# Patient Record
Sex: Male | Born: 1980 | Race: Black or African American | Hispanic: No | Marital: Single | Smoking: Never smoker
Health system: Southern US, Community
[De-identification: ages and names within clinical notes are randomized; demographics above are authoritative.]

## PROBLEM LIST (undated history)

## (undated) DIAGNOSIS — F79 Unspecified intellectual disabilities: Secondary | ICD-10-CM

## (undated) DIAGNOSIS — E119 Type 2 diabetes mellitus without complications: Secondary | ICD-10-CM

## (undated) DIAGNOSIS — M419 Scoliosis, unspecified: Secondary | ICD-10-CM

## (undated) HISTORY — PX: BACK SURGERY: SHX140

---

## 2012-01-19 DIAGNOSIS — R42 Dizziness and giddiness: Secondary | ICD-10-CM | POA: Diagnosis not present

## 2012-01-19 DIAGNOSIS — M549 Dorsalgia, unspecified: Secondary | ICD-10-CM | POA: Diagnosis not present

## 2012-01-19 DIAGNOSIS — F3342 Major depressive disorder, recurrent, in full remission: Secondary | ICD-10-CM | POA: Diagnosis not present

## 2012-01-19 DIAGNOSIS — F3132 Bipolar disorder, current episode depressed, moderate: Secondary | ICD-10-CM | POA: Diagnosis not present

## 2012-04-20 DIAGNOSIS — M4120 Other idiopathic scoliosis, site unspecified: Secondary | ICD-10-CM | POA: Diagnosis not present

## 2012-05-09 DIAGNOSIS — F3132 Bipolar disorder, current episode depressed, moderate: Secondary | ICD-10-CM | POA: Diagnosis not present

## 2012-08-09 DIAGNOSIS — M549 Dorsalgia, unspecified: Secondary | ICD-10-CM | POA: Diagnosis not present

## 2012-08-09 DIAGNOSIS — F3132 Bipolar disorder, current episode depressed, moderate: Secondary | ICD-10-CM | POA: Diagnosis not present

## 2012-08-10 DIAGNOSIS — Z981 Arthrodesis status: Secondary | ICD-10-CM | POA: Diagnosis not present

## 2012-08-10 DIAGNOSIS — IMO0002 Reserved for concepts with insufficient information to code with codable children: Secondary | ICD-10-CM | POA: Diagnosis not present

## 2012-08-10 DIAGNOSIS — M4120 Other idiopathic scoliosis, site unspecified: Secondary | ICD-10-CM | POA: Diagnosis not present

## 2012-08-10 DIAGNOSIS — S32009A Unspecified fracture of unspecified lumbar vertebra, initial encounter for closed fracture: Secondary | ICD-10-CM | POA: Diagnosis not present

## 2012-08-10 DIAGNOSIS — M549 Dorsalgia, unspecified: Secondary | ICD-10-CM | POA: Diagnosis not present

## 2012-12-13 DIAGNOSIS — F3132 Bipolar disorder, current episode depressed, moderate: Secondary | ICD-10-CM | POA: Diagnosis not present

## 2012-12-13 DIAGNOSIS — Z131 Encounter for screening for diabetes mellitus: Secondary | ICD-10-CM | POA: Diagnosis not present

## 2012-12-13 DIAGNOSIS — E119 Type 2 diabetes mellitus without complications: Secondary | ICD-10-CM | POA: Diagnosis not present

## 2012-12-13 DIAGNOSIS — Z13220 Encounter for screening for lipoid disorders: Secondary | ICD-10-CM | POA: Diagnosis not present

## 2012-12-13 DIAGNOSIS — E782 Mixed hyperlipidemia: Secondary | ICD-10-CM | POA: Diagnosis not present

## 2013-03-14 DIAGNOSIS — F3110 Bipolar disorder, current episode manic without psychotic features, unspecified: Secondary | ICD-10-CM | POA: Diagnosis not present

## 2013-03-29 DIAGNOSIS — F79 Unspecified intellectual disabilities: Secondary | ICD-10-CM | POA: Diagnosis not present

## 2013-03-29 DIAGNOSIS — M4120 Other idiopathic scoliosis, site unspecified: Secondary | ICD-10-CM | POA: Diagnosis not present

## 2013-05-28 DIAGNOSIS — F71 Moderate intellectual disabilities: Secondary | ICD-10-CM | POA: Diagnosis not present

## 2013-05-28 DIAGNOSIS — M4120 Other idiopathic scoliosis, site unspecified: Secondary | ICD-10-CM | POA: Diagnosis not present

## 2013-07-21 DIAGNOSIS — M549 Dorsalgia, unspecified: Secondary | ICD-10-CM | POA: Diagnosis not present

## 2013-07-27 DIAGNOSIS — M4120 Other idiopathic scoliosis, site unspecified: Secondary | ICD-10-CM | POA: Diagnosis not present

## 2013-07-27 DIAGNOSIS — F79 Unspecified intellectual disabilities: Secondary | ICD-10-CM | POA: Diagnosis not present

## 2013-10-27 DIAGNOSIS — R42 Dizziness and giddiness: Secondary | ICD-10-CM | POA: Diagnosis not present

## 2013-10-27 DIAGNOSIS — M549 Dorsalgia, unspecified: Secondary | ICD-10-CM | POA: Diagnosis not present

## 2013-10-27 DIAGNOSIS — Z79899 Other long term (current) drug therapy: Secondary | ICD-10-CM | POA: Diagnosis not present

## 2014-02-02 DIAGNOSIS — Z136 Encounter for screening for cardiovascular disorders: Secondary | ICD-10-CM | POA: Diagnosis not present

## 2014-02-02 DIAGNOSIS — IMO0001 Reserved for inherently not codable concepts without codable children: Secondary | ICD-10-CM | POA: Diagnosis not present

## 2014-04-27 DIAGNOSIS — IMO0001 Reserved for inherently not codable concepts without codable children: Secondary | ICD-10-CM | POA: Diagnosis not present

## 2014-06-12 DIAGNOSIS — F72 Severe intellectual disabilities: Secondary | ICD-10-CM | POA: Diagnosis not present

## 2014-06-16 DIAGNOSIS — F72 Severe intellectual disabilities: Secondary | ICD-10-CM | POA: Diagnosis not present

## 2014-08-03 DIAGNOSIS — IMO0001 Reserved for inherently not codable concepts without codable children: Secondary | ICD-10-CM | POA: Diagnosis not present

## 2014-11-30 DIAGNOSIS — E1165 Type 2 diabetes mellitus with hyperglycemia: Secondary | ICD-10-CM | POA: Diagnosis not present

## 2015-03-08 DIAGNOSIS — I10 Essential (primary) hypertension: Secondary | ICD-10-CM | POA: Diagnosis not present

## 2015-03-08 DIAGNOSIS — Z0000 Encounter for general adult medical examination without abnormal findings: Secondary | ICD-10-CM | POA: Diagnosis not present

## 2015-03-08 DIAGNOSIS — E1165 Type 2 diabetes mellitus with hyperglycemia: Secondary | ICD-10-CM | POA: Diagnosis not present

## 2015-03-15 DIAGNOSIS — I10 Essential (primary) hypertension: Secondary | ICD-10-CM | POA: Diagnosis not present

## 2015-03-15 DIAGNOSIS — E1165 Type 2 diabetes mellitus with hyperglycemia: Secondary | ICD-10-CM | POA: Diagnosis not present

## 2015-03-28 DIAGNOSIS — M419 Scoliosis, unspecified: Secondary | ICD-10-CM | POA: Diagnosis not present

## 2015-03-28 DIAGNOSIS — M549 Dorsalgia, unspecified: Secondary | ICD-10-CM | POA: Diagnosis not present

## 2015-03-28 DIAGNOSIS — Z981 Arthrodesis status: Secondary | ICD-10-CM | POA: Diagnosis not present

## 2015-06-07 DIAGNOSIS — I10 Essential (primary) hypertension: Secondary | ICD-10-CM | POA: Diagnosis not present

## 2015-06-07 DIAGNOSIS — E1165 Type 2 diabetes mellitus with hyperglycemia: Secondary | ICD-10-CM | POA: Diagnosis not present

## 2015-09-09 DIAGNOSIS — E1165 Type 2 diabetes mellitus with hyperglycemia: Secondary | ICD-10-CM | POA: Diagnosis not present

## 2015-09-09 DIAGNOSIS — I10 Essential (primary) hypertension: Secondary | ICD-10-CM | POA: Diagnosis not present

## 2015-12-09 DIAGNOSIS — I10 Essential (primary) hypertension: Secondary | ICD-10-CM | POA: Diagnosis not present

## 2015-12-09 DIAGNOSIS — E1165 Type 2 diabetes mellitus with hyperglycemia: Secondary | ICD-10-CM | POA: Diagnosis not present

## 2016-03-09 DIAGNOSIS — I10 Essential (primary) hypertension: Secondary | ICD-10-CM | POA: Diagnosis not present

## 2016-03-09 DIAGNOSIS — E1165 Type 2 diabetes mellitus with hyperglycemia: Secondary | ICD-10-CM | POA: Diagnosis not present

## 2016-04-29 DIAGNOSIS — E1165 Type 2 diabetes mellitus with hyperglycemia: Secondary | ICD-10-CM | POA: Diagnosis not present

## 2016-04-29 DIAGNOSIS — I10 Essential (primary) hypertension: Secondary | ICD-10-CM | POA: Diagnosis not present

## 2016-05-18 DIAGNOSIS — I10 Essential (primary) hypertension: Secondary | ICD-10-CM | POA: Diagnosis not present

## 2016-05-18 DIAGNOSIS — E1165 Type 2 diabetes mellitus with hyperglycemia: Secondary | ICD-10-CM | POA: Diagnosis not present

## 2016-06-23 DIAGNOSIS — F338 Other recurrent depressive disorders: Secondary | ICD-10-CM | POA: Diagnosis not present

## 2016-06-23 DIAGNOSIS — I10 Essential (primary) hypertension: Secondary | ICD-10-CM | POA: Diagnosis not present

## 2016-06-23 DIAGNOSIS — E1165 Type 2 diabetes mellitus with hyperglycemia: Secondary | ICD-10-CM | POA: Diagnosis not present

## 2016-08-03 ENCOUNTER — Encounter (HOSPITAL_COMMUNITY): Payer: Self-pay | Admitting: Emergency Medicine

## 2016-08-03 ENCOUNTER — Emergency Department (HOSPITAL_COMMUNITY)
Admission: EM | Admit: 2016-08-03 | Discharge: 2016-08-03 | Disposition: A | Discharge status: Home / Self Care | Payer: Medicare Other | Attending: Emergency Medicine | Admitting: Emergency Medicine

## 2016-08-03 ENCOUNTER — Emergency Department (HOSPITAL_COMMUNITY): Payer: Medicare Other

## 2016-08-03 DIAGNOSIS — Y999 Unspecified external cause status: Secondary | ICD-10-CM | POA: Diagnosis not present

## 2016-08-03 DIAGNOSIS — W19XXXA Unspecified fall, initial encounter: Secondary | ICD-10-CM | POA: Insufficient documentation

## 2016-08-03 DIAGNOSIS — E119 Type 2 diabetes mellitus without complications: Secondary | ICD-10-CM | POA: Diagnosis not present

## 2016-08-03 DIAGNOSIS — S0990XA Unspecified injury of head, initial encounter: Secondary | ICD-10-CM | POA: Diagnosis not present

## 2016-08-03 DIAGNOSIS — Y939 Activity, unspecified: Secondary | ICD-10-CM | POA: Diagnosis not present

## 2016-08-03 DIAGNOSIS — Y929 Unspecified place or not applicable: Secondary | ICD-10-CM | POA: Insufficient documentation

## 2016-08-03 DIAGNOSIS — R51 Headache: Secondary | ICD-10-CM | POA: Insufficient documentation

## 2016-08-03 DIAGNOSIS — R1110 Vomiting, unspecified: Secondary | ICD-10-CM | POA: Insufficient documentation

## 2016-08-03 DIAGNOSIS — Z043 Encounter for examination and observation following other accident: Secondary | ICD-10-CM | POA: Diagnosis not present

## 2016-08-03 DIAGNOSIS — S199XXA Unspecified injury of neck, initial encounter: Secondary | ICD-10-CM | POA: Diagnosis not present

## 2016-08-03 HISTORY — DX: Scoliosis, unspecified: M41.9

## 2016-08-03 HISTORY — DX: Unspecified intellectual disabilities: F79

## 2016-08-03 HISTORY — DX: Type 2 diabetes mellitus without complications: E11.9

## 2016-08-03 LAB — BASIC METABOLIC PANEL
ANION GAP: 8 (ref 5–15)
BUN: 13 mg/dL (ref 6–20)
CHLORIDE: 101 mmol/L (ref 101–111)
CO2: 29 mmol/L (ref 22–32)
Calcium: 9.3 mg/dL (ref 8.9–10.3)
Creatinine, Ser: 0.85 mg/dL (ref 0.61–1.24)
GFR calc Af Amer: >60 mL/min (ref >60)
GFR calc non Af Amer: >60 mL/min (ref >60)
Glucose, Bld: 83 mg/dL (ref 65–99)
POTASSIUM: 3.7 mmol/L (ref 3.5–5.1)
SODIUM: 138 mmol/L (ref 135–145)

## 2016-08-03 LAB — CBC WITH DIFFERENTIAL/PLATELET
BASOS ABS: 0 10*3/uL (ref 0.0–0.1)
Basophils Relative: 0 %
EOS ABS: 0 10*3/uL (ref 0.0–0.7)
Eosinophils Relative: 0 %
HCT: 40.2 % (ref 39.0–52.0)
HEMOGLOBIN: 13.5 g/dL (ref 13.0–17.0)
LYMPHS ABS: 1 10*3/uL (ref 0.7–4.0)
LYMPHS PCT: 14 %
MCH: 27.7 pg (ref 26.0–34.0)
MCHC: 33.6 g/dL (ref 30.0–36.0)
MCV: 82.4 fL (ref 78.0–100.0)
Monocytes Absolute: 0.3 10*3/uL (ref 0.1–1.0)
Monocytes Relative: 5 %
NEUTROS PCT: 81 %
Neutro Abs: 5.5 10*3/uL (ref 1.7–7.7)
Platelets: 181 10*3/uL (ref 150–400)
RBC: 4.88 MIL/uL (ref 4.22–5.81)
RDW: 13 % (ref 11.5–15.5)
WBC: 6.8 10*3/uL (ref 4.0–10.5)

## 2016-08-03 NOTE — Discharge Instructions (Signed)
Follow-up with your family doctor if any problems. Take Tylenol for pain

## 2016-08-03 NOTE — ED Notes (Signed)
Patient transported to CT 

## 2016-08-03 NOTE — ED Provider Notes (Signed)
[facility] Provider Note   CSN: [id number] Arrival date & time: 08/03/16  1054  By signing my name below, I, Placido Sou, attest that this documentation has been prepared under the direction and in the presence of Bethann Berkshire, MD. Electronically Signed: Placido Sou, ED Scribe. 08/03/16. 12:30 PM.   History   Chief Complaint Chief Complaint  Patient presents with  . Fall    HPI HPI Comments: Phillip White is a 35 y.o. male with a h/o MR who presents to the Emergency Department complaining of a fall that occurred this morning. His mother states he resides at a facility for adults with MR and that she was told he fell this morning and the staff is unsure if he struck his head. He has experienced 2x vomiting since the fall and the staff told her he was disoriented. Pt endorses posterior scalp tenderness but his mother denies he is experiencing any. His mother states he is currently behaving at his baseline. He denies n/v or other associated symptoms noted at this time.   The history is provided by the patient, a parent, a caregiver and medical records. No language interpreter was used.  Fall  This is a new problem. The current episode started 3 to 5 hours ago. The problem occurs rarely. The problem has not changed since onset.Associated symptoms include headaches. Pertinent negatives include no chest pain and no abdominal pain. Nothing aggravates the symptoms. Nothing relieves the symptoms. He has tried nothing for the symptoms. The treatment provided no relief.    Past Medical History:  Diagnosis Date  . Diabetes mellitus without complication (HCC)    on metformin  . Mental retardation   . Scoliosis     There are no active problems to display for this patient.   Past Surgical History:  Procedure Laterality Date  . BACK SURGERY         Home Medications    Prior to Admission medications   Not on File    Family History Family History  Problem Relation  Age of Onset  . Diabetes Mother   . Alzheimer's disease Other   . Heart failure Other     Social History Social History  Substance Use Topics  . Smoking status: Never Smoker  . Smokeless tobacco: Never Used  . Alcohol use No     Allergies   Review of patient's allergies indicates no known allergies.   Review of Systems Review of Systems  Constitutional: Negative for appetite change and fatigue.  HENT: Negative for congestion, ear discharge and sinus pressure.   Eyes: Negative for discharge.  Respiratory: Negative for cough.   Cardiovascular: Negative for chest pain.  Gastrointestinal: Positive for vomiting. Negative for abdominal pain, diarrhea and nausea.  Genitourinary: Negative for frequency and hematuria.  Musculoskeletal: Negative for back pain.  Skin: Negative for rash.  Neurological: Positive for headaches. Negative for seizures.  Psychiatric/Behavioral: Negative for hallucinations.   Physical Exam Updated Vital Signs BP 99/65 (BP Location: Right Arm)   Pulse 69   Temp 97.5 F (36.4 C) (Oral)   Resp 16   Ht 5\' 6"  (1.676 m)   Wt 153 lb (69.4 kg)   SpO2 100%   BMI 24.69 kg/m   Physical Exam  HENT:  Head: Normocephalic.  Tenderness to occipital head   Eyes: Conjunctivae and EOM are normal. No scleral icterus.  Neck: Neck supple. No thyromegaly present.  Cardiovascular: Normal rate and regular rhythm.  Exam reveals no gallop and no friction rub.  No murmur heard. Pulmonary/Chest: No stridor. He has no wheezes. He has no rales. He exhibits no tenderness.  Abdominal: He exhibits no distension. There is no tenderness. There is no rebound.  Musculoskeletal: Normal range of motion. He exhibits no edema.  Lymphadenopathy:    He has no cervical adenopathy.  Neurological: He exhibits normal muscle tone. Coordination normal.  Pt is oriented and exhibits his nml mental deficiencies   Skin: No rash noted. No erythema.  Psychiatric: He has a normal mood and  affect. His behavior is normal.   ED Treatments / Results  Labs (all labs ordered are listed, but only abnormal results are displayed) Labs Reviewed - No data to display  EKG  EKG Interpretation None       Radiology No results found.  Procedures Procedures  DIAGNOSTIC STUDIES: Oxygen Saturation is 100% on RA, normal by my interpretation.    COORDINATION OF CARE: 12:30 PM Discussed next steps with pt and his mother. His mother verbalized understanding and is agreeable with the plan.    Medications Ordered in ED Medications - No data to display   Initial Impression / Assessment and Plan / ED Course  I have reviewed the triage vital signs and the nursing notes.  Pertinent labs & imaging results that were available during my care of the patient were reviewed by me and considered in my medical decision making (see chart for details).  Clinical Course  Patient with fall with no injuries he is to follow-up with his PCP   Final Clinical Impressions(s) / ED Diagnoses   Final diagnoses:  None    New Prescriptions New Prescriptions   No medications on file     Bethann BerkshireJoseph Zaia Carre, MD 08/03/16 1537

## 2016-08-03 NOTE — ED Triage Notes (Signed)
Pt fell this am, facility unsure if pt hit his head. Pt has been disoriented and has vomited x 2. Pt ambulatory to room.

## 2016-08-14 DIAGNOSIS — E1165 Type 2 diabetes mellitus with hyperglycemia: Secondary | ICD-10-CM | POA: Diagnosis not present

## 2016-08-14 DIAGNOSIS — I10 Essential (primary) hypertension: Secondary | ICD-10-CM | POA: Diagnosis not present

## 2016-08-21 DIAGNOSIS — E1165 Type 2 diabetes mellitus with hyperglycemia: Secondary | ICD-10-CM | POA: Diagnosis not present

## 2016-08-21 DIAGNOSIS — I10 Essential (primary) hypertension: Secondary | ICD-10-CM | POA: Diagnosis not present

## 2016-08-27 DIAGNOSIS — I10 Essential (primary) hypertension: Secondary | ICD-10-CM | POA: Diagnosis not present

## 2016-08-27 DIAGNOSIS — E1165 Type 2 diabetes mellitus with hyperglycemia: Secondary | ICD-10-CM | POA: Diagnosis not present

## 2016-08-27 DIAGNOSIS — E119 Type 2 diabetes mellitus without complications: Secondary | ICD-10-CM | POA: Diagnosis not present

## 2016-09-17 DIAGNOSIS — I10 Essential (primary) hypertension: Secondary | ICD-10-CM | POA: Diagnosis not present

## 2016-09-17 DIAGNOSIS — E1165 Type 2 diabetes mellitus with hyperglycemia: Secondary | ICD-10-CM | POA: Diagnosis not present

## 2016-09-23 DIAGNOSIS — I10 Essential (primary) hypertension: Secondary | ICD-10-CM | POA: Diagnosis not present

## 2016-09-23 DIAGNOSIS — E1165 Type 2 diabetes mellitus with hyperglycemia: Secondary | ICD-10-CM | POA: Diagnosis not present

## 2016-09-23 DIAGNOSIS — F338 Other recurrent depressive disorders: Secondary | ICD-10-CM | POA: Diagnosis not present

## 2016-10-22 DIAGNOSIS — I10 Essential (primary) hypertension: Secondary | ICD-10-CM | POA: Diagnosis not present

## 2016-10-22 DIAGNOSIS — E1165 Type 2 diabetes mellitus with hyperglycemia: Secondary | ICD-10-CM | POA: Diagnosis not present

## 2016-12-25 DIAGNOSIS — E1165 Type 2 diabetes mellitus with hyperglycemia: Secondary | ICD-10-CM | POA: Diagnosis not present

## 2016-12-25 DIAGNOSIS — F338 Other recurrent depressive disorders: Secondary | ICD-10-CM | POA: Diagnosis not present

## 2016-12-25 DIAGNOSIS — I10 Essential (primary) hypertension: Secondary | ICD-10-CM | POA: Diagnosis not present

## 2017-02-11 DIAGNOSIS — F338 Other recurrent depressive disorders: Secondary | ICD-10-CM | POA: Diagnosis not present

## 2017-02-11 DIAGNOSIS — E1165 Type 2 diabetes mellitus with hyperglycemia: Secondary | ICD-10-CM | POA: Diagnosis not present

## 2017-02-11 DIAGNOSIS — I10 Essential (primary) hypertension: Secondary | ICD-10-CM | POA: Diagnosis not present

## 2017-03-26 DIAGNOSIS — E1165 Type 2 diabetes mellitus with hyperglycemia: Secondary | ICD-10-CM | POA: Diagnosis not present

## 2017-03-26 DIAGNOSIS — F338 Other recurrent depressive disorders: Secondary | ICD-10-CM | POA: Diagnosis not present

## 2017-03-26 DIAGNOSIS — I10 Essential (primary) hypertension: Secondary | ICD-10-CM | POA: Diagnosis not present

## 2017-06-15 DIAGNOSIS — I10 Essential (primary) hypertension: Secondary | ICD-10-CM | POA: Diagnosis not present

## 2017-06-15 DIAGNOSIS — E1165 Type 2 diabetes mellitus with hyperglycemia: Secondary | ICD-10-CM | POA: Diagnosis not present

## 2017-06-27 ENCOUNTER — Emergency Department (HOSPITAL_COMMUNITY)
Admission: EM | Admit: 2017-06-27 | Discharge: 2017-06-28 | Disposition: A | Discharge status: Home / Self Care | Payer: Medicare Other | Attending: Emergency Medicine | Admitting: Emergency Medicine

## 2017-06-27 ENCOUNTER — Emergency Department (HOSPITAL_COMMUNITY): Payer: Medicare Other

## 2017-06-27 DIAGNOSIS — E119 Type 2 diabetes mellitus without complications: Secondary | ICD-10-CM | POA: Diagnosis not present

## 2017-06-27 DIAGNOSIS — Y658 Other specified misadventures during surgical and medical care: Secondary | ICD-10-CM | POA: Diagnosis not present

## 2017-06-27 DIAGNOSIS — Z7984 Long term (current) use of oral hypoglycemic drugs: Secondary | ICD-10-CM | POA: Insufficient documentation

## 2017-06-27 DIAGNOSIS — T7849XA Other allergy, initial encounter: Secondary | ICD-10-CM | POA: Diagnosis not present

## 2017-06-27 DIAGNOSIS — R220 Localized swelling, mass and lump, head: Secondary | ICD-10-CM | POA: Diagnosis not present

## 2017-06-27 DIAGNOSIS — R509 Fever, unspecified: Secondary | ICD-10-CM

## 2017-06-27 DIAGNOSIS — T7840XA Allergy, unspecified, initial encounter: Secondary | ICD-10-CM

## 2017-06-27 DIAGNOSIS — T887XXA Unspecified adverse effect of drug or medicament, initial encounter: Secondary | ICD-10-CM | POA: Insufficient documentation

## 2017-06-27 DIAGNOSIS — Z79899 Other long term (current) drug therapy: Secondary | ICD-10-CM | POA: Insufficient documentation

## 2017-06-27 DIAGNOSIS — R21 Rash and other nonspecific skin eruption: Secondary | ICD-10-CM | POA: Diagnosis present

## 2017-06-27 LAB — COMPREHENSIVE METABOLIC PANEL
ALK PHOS: 51 U/L (ref 38–126)
ALT: 157 U/L — AB (ref 17–63)
AST: 134 U/L — ABNORMAL HIGH (ref 15–41)
Albumin: 3.4 g/dL — ABNORMAL LOW (ref 3.5–5.0)
Anion gap: 9 (ref 5–15)
BILIRUBIN TOTAL: 0.5 mg/dL (ref 0.3–1.2)
BUN: 10 mg/dL (ref 6–20)
CALCIUM: 8.5 mg/dL — AB (ref 8.9–10.3)
CHLORIDE: 97 mmol/L — AB (ref 101–111)
CO2: 27 mmol/L (ref 22–32)
CREATININE: 1.23 mg/dL (ref 0.61–1.24)
Glucose, Bld: 134 mg/dL — ABNORMAL HIGH (ref 65–99)
Potassium: 3.4 mmol/L — ABNORMAL LOW (ref 3.5–5.1)
Sodium: 133 mmol/L — ABNORMAL LOW (ref 135–145)
TOTAL PROTEIN: 6.5 g/dL (ref 6.5–8.1)

## 2017-06-27 LAB — CBC WITH DIFFERENTIAL/PLATELET
Basophils Absolute: 0 10*3/uL (ref 0.0–0.1)
Basophils Relative: 0 %
EOS PCT: 7 %
Eosinophils Absolute: 0.2 10*3/uL (ref 0.0–0.7)
HEMATOCRIT: 38 % — AB (ref 39.0–52.0)
HEMOGLOBIN: 12.9 g/dL — AB (ref 13.0–17.0)
LYMPHS ABS: 0.9 10*3/uL (ref 0.7–4.0)
LYMPHS PCT: 24 %
MCH: 27.8 pg (ref 26.0–34.0)
MCHC: 33.9 g/dL (ref 30.0–36.0)
MCV: 81.9 fL (ref 78.0–100.0)
Monocytes Absolute: 0.2 10*3/uL (ref 0.1–1.0)
Monocytes Relative: 5 %
NEUTROS ABS: 2.3 10*3/uL (ref 1.7–7.7)
Neutrophils Relative %: 64 %
Platelets: 146 10*3/uL — ABNORMAL LOW (ref 150–400)
RBC: 4.64 MIL/uL (ref 4.22–5.81)
RDW: 13.4 % (ref 11.5–15.5)
WBC: 3.6 10*3/uL — AB (ref 4.0–10.5)

## 2017-06-27 LAB — I-STAT CG4 LACTIC ACID, ED: LACTIC ACID, VENOUS: 1.06 mmol/L (ref 0.5–1.9)

## 2017-06-27 MED ORDER — SODIUM CHLORIDE 0.9 % IV BOLUS (SEPSIS)
1000.0000 mL | Freq: Once | INTRAVENOUS | Status: AC
  Administered 2017-06-28: 1000 mL via INTRAVENOUS

## 2017-06-27 MED ORDER — EPINEPHRINE 0.3 MG/0.3ML IJ SOAJ: 0.3000 mg | Freq: Once | INTRAMUSCULAR | 1 refills | Status: AC | PRN

## 2017-06-27 MED ORDER — EPINEPHRINE 0.3 MG/0.3ML IJ SOAJ
0.3000 mg | Freq: Once | INTRAMUSCULAR | Status: DC
  Filled 2017-06-27 (×2): qty 0.3

## 2017-06-27 MED ORDER — FAMOTIDINE IN NACL 20-0.9 MG/50ML-% IV SOLN
20.0000 mg | INTRAVENOUS | Status: AC
  Administered 2017-06-27: 20 mg via INTRAVENOUS
  Filled 2017-06-27: qty 50

## 2017-06-27 MED ORDER — EPINEPHRINE 0.3 MG/0.3ML IJ SOAJ
INTRAMUSCULAR | Status: AC
  Administered 2017-06-27: 0.3 mg via INTRAMUSCULAR
  Filled 2017-06-27: qty 0.3

## 2017-06-27 MED ORDER — SODIUM CHLORIDE 0.9 % IV BOLUS (SEPSIS)
1000.0000 mL | Freq: Once | INTRAVENOUS | Status: AC
  Administered 2017-06-27: 1000 mL via INTRAVENOUS

## 2017-06-27 MED ORDER — SODIUM CHLORIDE 0.9 % IV BOLUS (SEPSIS): 2000.0000 mL | Freq: Once | INTRAVENOUS | Status: DC

## 2017-06-27 MED ORDER — PREDNISONE 20 MG PO TABS: 40.0000 mg | ORAL_TABLET | Freq: Every day | ORAL | 0 refills | Status: AC

## 2017-06-27 MED ORDER — ACETAMINOPHEN 325 MG PO TABS
650.0000 mg | ORAL_TABLET | Freq: Once | ORAL | Status: AC
  Administered 2017-06-27: 650 mg via ORAL
  Filled 2017-06-27: qty 2

## 2017-06-27 MED ORDER — METHYLPREDNISOLONE SODIUM SUCC 125 MG IJ SOLR
INTRAMUSCULAR | Status: AC
  Filled 2017-06-27: qty 2

## 2017-06-27 MED ORDER — METHYLPREDNISOLONE SODIUM SUCC 125 MG IJ SOLR
125.0000 mg | Freq: Once | INTRAMUSCULAR | Status: AC
  Administered 2017-06-27: 125 mg via INTRAVENOUS

## 2017-06-27 MED ORDER — EPINEPHRINE 0.3 MG/0.3ML IJ SOAJ
0.3000 mg | Freq: Once | INTRAMUSCULAR | Status: AC
  Administered 2017-06-27 (×2): 0.3 mg via INTRAMUSCULAR
  Filled 2017-06-27: qty 0.3

## 2017-06-27 MED ORDER — DIPHENHYDRAMINE HCL 50 MG/ML IJ SOLN
25.0000 mg | Freq: Once | INTRAMUSCULAR | Status: AC
  Administered 2017-06-27: 25 mg via INTRAVENOUS
  Filled 2017-06-27: qty 1

## 2017-06-27 NOTE — ED Provider Notes (Signed)
I assumed care at signout from Dr Estell HarpinZammit (who had been given report by dr Hyacinth Meekermiller) Pt with special needs/history difficult Mother reports he had onset of rash today, unknown cause No known insect/tick bites No vomiting/diarrhea/cough She reports he does not appear in pain He now has fever Plan to work up fever, with lactate/labs/urine/cxr Monitor rash and allergic symptoms in the ED Currently rash appears allergic in nature Will follow closely    Zadie RhineWickline, Dustine Stickler, MD 06/27/17 2337

## 2017-06-27 NOTE — ED Notes (Signed)
Pt rash appears to be resolving a bit his lips are less swollen

## 2017-06-27 NOTE — ED Provider Notes (Signed)
[facility] Provider Note   CSN: [id number] Arrival date & time: 06/27/17  2059     History   Chief Complaint Chief Complaint  Patient presents with  . Allergic Reaction    HPI Phillip White is a 36 y.o. male.  HPI  The patient is a 36 year old male, he has a history of mental retardation as well as scoliosis, he is also a diabetic. The family members report that he recently started taking a new medication, this was one month ago and he has not had any problems with it. There has been no other exposures however today the family states that he developed urticaria this morning, they gave him 50 mg of Benadryl with minimal improvement, throughout the day the patient developed some swelling of the upper lip, the periorbital regions especially on the left and around the left ear and because the hives came back they gave him more Benadryl at 5:00 PM.  He has continued to have urticaria diffusely over his entire body, itching and the swelling has not improved thus the family brought him to the hospital for further evaluation. Level V caveat apply secondary to mental retardation. The patient is unable to give me any further history. The family reports that there has been no exposure to new medications, new foods, new clothes, new pets, or any other specific allergen possibility.  Past Medical History:  Diagnosis Date  . Diabetes mellitus without complication (HCC)    on metformin  . Mental retardation   . Scoliosis     There are no active problems to display for this patient.   Past Surgical History:  Procedure Laterality Date  . BACK SURGERY         Home Medications    Prior to Admission medications   Medication Sig Start Date End Date Taking? Authorizing Provider  benazepril (LOTENSIN) 5 MG tablet Take 5 mg by mouth daily.   Yes [provider]  benztropine (COGENTIN) 0.5 MG tablet Take 1 tablet by mouth 2 (two) times daily. 07/31/16  Yes [provider]  citalopram (CELEXA) 20 MG tablet Take 1 tablet by mouth daily. 06/23/16  Yes [provider]  lamoTRIgine (LAMICTAL) 100 MG tablet Take 1 tablet by mouth 2 (two) times daily. 06/01/17  Yes [provider]  metFORMIN (GLUCOPHAGE) 500 MG tablet Take 1 tablet by mouth every evening. 06/15/16  Yes [provider]  risperidone (RISPERDAL) 4 MG tablet Take 1 tablet by mouth 2 (two) times daily. 06/26/16  Yes [provider]  EPINEPHrine (EPIPEN 2-PAK) 0.3 mg/0.3 mL IJ SOAJ injection Inject 0.3 mLs (0.3 mg total) into the muscle once as needed (for severe allergic reaction). CAll 911 immediately if you have to use this medicine 06/27/17   Eber HongMiller, Vennela Jutte, MD  predniSONE (DELTASONE) 20 MG tablet Take 2 tablets (40 mg total) by mouth daily. 06/27/17   Eber HongMiller, Leanny Moeckel, MD    Family History Family History  Problem Relation Age of Onset  . Diabetes Mother   . Alzheimer's disease Other   . Heart failure Other     Social History Social History  Substance Use Topics  . Smoking status: Never Smoker  . Smokeless tobacco: Never Used  . Alcohol use No     Allergies   Patient has no known allergies.   Review of Systems Review of Systems  Unable to perform ROS: Psychiatric disorder     Physical Exam Updated Vital Signs BP (!) 96/47   Pulse 93  Temp 98.4 F (36.9 C) (Oral)   Resp 18   Ht 5\' 6"  (1.676 m)   Wt 69.4 kg (153 lb)   SpO2 96%   BMI 24.69 kg/m   Physical Exam  Constitutional: He appears well-developed and well-nourished. No distress.  HENT:  Head: Normocephalic and atraumatic.  Mouth/Throat: Oropharynx is clear and moist. No oropharyngeal exudate.  There is mild swelling of the upper lip as well as the left periorbital area. The oropharynx is clear, moist and has normal-appearing posterior pharynx without swelling of the posterior tissues, no swelling of the tongue, no difficulty breathing, tolerating secretions without difficulty.    Eyes: Pupils are equal, round, and reactive to light. Conjunctivae and EOM are normal. Right eye exhibits no discharge. Left eye exhibits no discharge. No scleral icterus.  Neck: Normal range of motion. Neck supple. No JVD present. No thyromegaly present.  Cardiovascular: Normal rate, regular rhythm, normal heart sounds and intact distal pulses.  Exam reveals no gallop and no friction rub.   No murmur heard. No tachycardia, normal pulses at the radial arteries  Pulmonary/Chest: Effort normal and breath sounds normal. No respiratory distress. He has no wheezes. He has no rales.  There is no increased work of breathing, there is no wheezing, the patient's lung sounds are clear and unlabored  Abdominal: Soft. Bowel sounds are normal. He exhibits no distension and no mass. There is no tenderness.  Musculoskeletal: Normal range of motion. He exhibits no edema or tenderness.  Severe scoliosis of the thoracolumbar spine  Lymphadenopathy:    He has no cervical adenopathy.  Neurological: He is alert. Coordination normal.  The patient is able to follow commands, he does very little in the way of verbal communication but has no difficulty using all 4 extremities.  Skin: Skin is warm and dry. Rash noted. There is erythema.  Diffuse urticarial lesions, arms, legs, trunk, face, neck  Psychiatric: He has a normal mood and affect. His behavior is normal.  Nursing note and vitals reviewed.    ED Treatments / Results  Labs (all labs ordered are listed, but only abnormal results are displayed) Labs Reviewed  CBC WITH DIFFERENTIAL/PLATELET - Abnormal; Notable for the following:       Result Value   WBC 3.6 (*)    Hemoglobin 12.9 (*)    HCT 38.0 (*)    Platelets 146 (*)    All other components within normal limits  COMPREHENSIVE METABOLIC PANEL - Abnormal; Notable for the following:    Sodium 133 (*)    Potassium 3.4 (*)    Chloride 97 (*)    Glucose, Bld 134 (*)    Calcium 8.5 (*)    Albumin 3.4  (*)    AST 134 (*)    ALT 157 (*)    All other components within normal limits     Radiology No results found.  Procedures Procedures (including critical care time)  Medications Ordered in ED Medications  EPINEPHrine (EPI-PEN) injection 0.3 mg (not administered)  methylPREDNISolone sodium succinate (SOLU-MEDROL) 125 mg/2 mL injection (not administered)  EPINEPHrine (EPI-PEN) 0.3 mg/0.3 mL injection (0.3 mg Intramuscular Given 06/27/17 2117)  methylPREDNISolone sodium succinate (SOLU-MEDROL) 125 mg/2 mL injection 125 mg (125 mg Intravenous Given 06/27/17 2132)  diphenhydrAMINE (BENADRYL) injection 25 mg (25 mg Intravenous Given 06/27/17 2133)  famotidine (PEPCID) IVPB 20 mg premix (20 mg Intravenous New Bag/Given 06/27/17 2133)     Initial Impression / Assessment and Plan / ED Course  I have reviewed the  triage vital signs and the nursing notes.  Pertinent labs & imaging results that were available during my care of the patient were reviewed by me and considered in my medical decision making (see chart for details).     Needs epipen Solumedrol pepcid Benadryl IV access obtained on arrival - no signs of airway compromise, no n/v and no other system involvement. Observation in ED Needs formal allergy testing.  Improved with medication, needs observation period, change of shift, care signed out to oncoming emergency physician  Final Clinical Impressions(s) / ED Diagnoses   Final diagnoses:  Allergic reaction, initial encounter    New Prescriptions New Prescriptions   EPINEPHRINE (EPIPEN 2-PAK) 0.3 MG/0.3 ML IJ SOAJ INJECTION    Inject 0.3 mLs (0.3 mg total) into the muscle once as needed (for severe allergic reaction). CAll 911 immediately if you have to use this medicine   PREDNISONE (DELTASONE) 20 MG TABLET    Take 2 tablets (40 mg total) by mouth daily.     Eber Hong, MD 06/27/17 2221

## 2017-06-27 NOTE — ED Triage Notes (Signed)
Awakened with hives throughout

## 2017-06-27 NOTE — ED Notes (Signed)
Pt presents with first time hives Covered head to toe Lips swollen  Per mother no new soap, food, etc  Pt has never had this happen before

## 2017-06-28 DIAGNOSIS — T887XXA Unspecified adverse effect of drug or medicament, initial encounter: Secondary | ICD-10-CM | POA: Diagnosis not present

## 2017-06-28 LAB — URINALYSIS, ROUTINE W REFLEX MICROSCOPIC
Bilirubin Urine: NEGATIVE
GLUCOSE, UA: NEGATIVE mg/dL
HGB URINE DIPSTICK: NEGATIVE
Ketones, ur: 5 mg/dL — AB
LEUKOCYTES UA: NEGATIVE
Nitrite: NEGATIVE
PH: 6 (ref 5.0–8.0)
PROTEIN: NEGATIVE mg/dL
SPECIFIC GRAVITY, URINE: 1.005 (ref 1.005–1.030)

## 2017-06-28 LAB — RAPID STREP SCREEN (MED CTR MEBANE ONLY): STREPTOCOCCUS, GROUP A SCREEN (DIRECT): NEGATIVE

## 2017-06-28 MED ORDER — DOXYCYCLINE MONOHYDRATE 25 MG/5ML PO SUSR: 100.0000 mg | Freq: Two times a day (BID) | ORAL | 0 refills | Status: AC

## 2017-06-28 NOTE — ED Provider Notes (Signed)
Pt improved Vitals improved Rash improved He is awake/alert, talkative He is nontoxic He wants to go home Mother feels comfortable taking him home Due to complexity and evidence of fever, tick borne illness not ruled out (also has HYPOnatremia and elevated LFTs) Will add on doxycycline Will also treat for allergic rxn He has PCP followup this week Discussed appropriate use of epipen and when to return to ER Will also need to have glucose monitored at home due to use of prednisone    Phillip RhineWickline, Phillip Taaffe, MD 06/28/17 0124

## 2017-06-29 ENCOUNTER — Emergency Department (HOSPITAL_COMMUNITY)
Admission: EM | Admit: 2017-06-29 | Discharge: 2017-06-29 | Disposition: A | Discharge status: Home / Self Care | Payer: Medicare Other | Attending: Emergency Medicine | Admitting: Emergency Medicine

## 2017-06-29 ENCOUNTER — Encounter (HOSPITAL_COMMUNITY): Payer: Self-pay | Admitting: Emergency Medicine

## 2017-06-29 DIAGNOSIS — R21 Rash and other nonspecific skin eruption: Secondary | ICD-10-CM | POA: Diagnosis not present

## 2017-06-29 DIAGNOSIS — E119 Type 2 diabetes mellitus without complications: Secondary | ICD-10-CM | POA: Insufficient documentation

## 2017-06-29 DIAGNOSIS — F79 Unspecified intellectual disabilities: Secondary | ICD-10-CM | POA: Insufficient documentation

## 2017-06-29 DIAGNOSIS — D721 Eosinophilia: Secondary | ICD-10-CM | POA: Diagnosis not present

## 2017-06-29 DIAGNOSIS — T50905A Adverse effect of unspecified drugs, medicaments and biological substances, initial encounter: Secondary | ICD-10-CM | POA: Diagnosis not present

## 2017-06-29 DIAGNOSIS — L270 Generalized skin eruption due to drugs and medicaments taken internally: Secondary | ICD-10-CM | POA: Diagnosis not present

## 2017-06-29 DIAGNOSIS — Z7984 Long term (current) use of oral hypoglycemic drugs: Secondary | ICD-10-CM | POA: Insufficient documentation

## 2017-06-29 LAB — CBC WITH DIFFERENTIAL/PLATELET
BASOS ABS: 0 10*3/uL (ref 0.0–0.1)
Basophils Relative: 0 %
EOS ABS: 0.1 10*3/uL (ref 0.0–0.7)
EOS PCT: 3 %
HCT: 37.7 % — ABNORMAL LOW (ref 39.0–52.0)
Hemoglobin: 13 g/dL (ref 13.0–17.0)
LYMPHS ABS: 0.5 10*3/uL — AB (ref 0.7–4.0)
LYMPHS PCT: 11 %
MCH: 27.9 pg (ref 26.0–34.0)
MCHC: 34.5 g/dL (ref 30.0–36.0)
MCV: 80.9 fL (ref 78.0–100.0)
MONO ABS: 0.3 10*3/uL (ref 0.1–1.0)
Monocytes Relative: 7 %
Neutro Abs: 3.6 10*3/uL (ref 1.7–7.7)
Neutrophils Relative %: 79 %
PLATELETS: 154 10*3/uL (ref 150–400)
RBC: 4.66 MIL/uL (ref 4.22–5.81)
RDW: 13.5 % (ref 11.5–15.5)
WBC: 4.5 10*3/uL (ref 4.0–10.5)

## 2017-06-29 LAB — URINE CULTURE: CULTURE: NO GROWTH

## 2017-06-29 LAB — COMPREHENSIVE METABOLIC PANEL
ALBUMIN: 3 g/dL — AB (ref 3.5–5.0)
ALT: 180 U/L — AB (ref 17–63)
AST: 119 U/L — AB (ref 15–41)
Alkaline Phosphatase: 37 U/L — ABNORMAL LOW (ref 38–126)
Anion gap: 9 (ref 5–15)
BILIRUBIN TOTAL: 0.6 mg/dL (ref 0.3–1.2)
BUN: 9 mg/dL (ref 6–20)
CO2: 26 mmol/L (ref 22–32)
CREATININE: 1.13 mg/dL (ref 0.61–1.24)
Calcium: 7.4 mg/dL — ABNORMAL LOW (ref 8.9–10.3)
Chloride: 95 mmol/L — ABNORMAL LOW (ref 101–111)
GFR calc Af Amer: >60 mL/min (ref >60)
GLUCOSE: 138 mg/dL — AB (ref 65–99)
POTASSIUM: 3.7 mmol/L (ref 3.5–5.1)
Sodium: 130 mmol/L — ABNORMAL LOW (ref 135–145)
TOTAL PROTEIN: 5.7 g/dL — AB (ref 6.5–8.1)

## 2017-06-29 LAB — ROCKY MTN SPOTTED FVR ABS PNL(IGG+IGM)
RMSF IgG: NEGATIVE
RMSF IgM: 0.36 index (ref 0.00–0.89)

## 2017-06-29 MED ORDER — SODIUM CHLORIDE 0.9 % IV BOLUS (SEPSIS)
1000.0000 mL | Freq: Once | INTRAVENOUS | Status: AC
  Administered 2017-06-29: 1000 mL via INTRAVENOUS

## 2017-06-29 NOTE — ED Provider Notes (Signed)
Emergency Department Provider Note   I have reviewed the triage vital signs and the nursing notes.   HISTORY  Chief Complaint Allergic Reaction   HPI Phillip White is a 36 y.o. male with PMH of DM, MR, and scoliosis presents to the emergency department for evaluation of continued rash over the arms and legs. He was evaluated in the emergency department yesterday with similar presentation. At that time he seemed to be having some lip swelling in addition to rash. Family state that the rash has not improved. They filled the medications today including steroid and doxycycline. He has taken one dose of each since leaving the emergency department yesterday. Family stated they've not noticed any itching or complaints of pain. No difficulty breathing. His lips do seem somewhat dry. No additional fevers. Patient did start Lamictal on 06/01/17 for agitation at his day program.   Level 5 caveat: Mental Disability.    Past Medical History:  Diagnosis Date  . Diabetes mellitus without complication (HCC)    on metformin  . Mental retardation   . Scoliosis     There are no active problems to display for this patient.   Past Surgical History:  Procedure Laterality Date  . BACK SURGERY      Current Outpatient Rx  . Order #: [id number] Class: Historical Med  . Order #: [id number] Class: Historical Med  . Order #: [id number] Class: Historical Med  . Order #: [id number] Class: Historical Med  . Order #: [id number] Class: Print  . Order #: [id number] Class: Historical Med  . Order #: [id number] Class: Print  . Order #: [id number] Class: Historical Med  . Order #: [id number] Class: Print    Allergies Lamictal [lamotrigine]  Family History  Problem Relation Age of Onset  . Diabetes Mother   . Alzheimer's disease Other   . Heart failure Other     Social History Social History  Substance Use Topics  . Smoking status: Never Smoker  . Smokeless tobacco: Never Used  . Alcohol use No     Review of Systems  Level 5 caveat: Mental Disability.   ____________________________________________   PHYSICAL EXAM:  VITAL SIGNS: ED Triage Vitals [06/29/17 1856]  Enc Vitals Group     BP 103/61     Pulse Rate (!) 105     Resp 18     Temp 98.5 F (36.9 C)     Temp Source Oral     SpO2 98 %     Weight 153 lb (69.4 kg)     Height 5\' 6"  (1.676 m)   Constitutional: Alert and oriented. Well appearing and in no acute distress. Eyes: Conjunctivae are normal. Head: Atraumatic. Nose: No congestion/rhinnorhea. Mouth/Throat: Mucous membranes are dry.  Neck: No stridor.   Cardiovascular: Normal rate, regular rhythm. Good peripheral circulation. Grossly normal heart sounds.   Respiratory: Normal respiratory effort.  No retractions. Lungs CTAB. Gastrointestinal: Soft and nontender. No distention.  Musculoskeletal: No lower extremity tenderness nor edema. No gross deformities of extremities. Neurologic:  Normal speech and language. No gross focal neurologic deficits are appreciated.  Skin:  Skin is warm, dry and intact. Diffuse, erythematous rash over arms, legs, and palms. Rash is blanching and non-patechial (legs pictured below).      ____________________________________________   LABS (all labs ordered are listed, but only abnormal results are displayed)  Labs Reviewed  COMPREHENSIVE METABOLIC PANEL - Abnormal; Notable for the following:       Result Value   Sodium 130 (*)    Chloride 95 (*)  Glucose, Bld 138 (*)    Calcium 7.4 (*)    Total Protein 5.7 (*)    Albumin 3.0 (*)    AST 119 (*)    ALT 180 (*)    Alkaline Phosphatase 37 (*)    All other components within normal limits  CBC WITH DIFFERENTIAL/PLATELET - Abnormal; Notable for the following:    HCT 37.7 (*)    Lymphs Abs 0.5 (*)    All other components within normal limits    ____________________________________________  RADIOLOGY  None ____________________________________________   PROCEDURES  Procedure(s) performed:   Procedures  None ____________________________________________   INITIAL IMPRESSION / ASSESSMENT AND PLAN / ED COURSE  Pertinent labs & imaging results that were available during my care of the patient were reviewed by me and considered in my medical decision making (see chart for details).  Patient presents to the emergency department for evaluation of continued rash in the setting of starting Lamictal. The medication was started 4 weeks prior. Rash in conjunction with fever and elevated liver enzymes on prior testing is very suspicious for DRESS. Plan for IV fluids and repeat lab work. Patient is hemodynamically stable. Doubt infectious etiology but started on Doxycycline.   09:52 PM Patient is liver enzymes are largely unchanged from yesterday. He does have some mild hyponatremia. The patient has no mucosal surface involvement to suggest SJS/TEN. He is overall well-appearing with normal vital signs and afebrile. I discussed immediately stopping Lamictal with mom and added it to his allergy list. He will continue steroids. No renal dysfunction. Mom can have him re-evaluated by PCP with repeat lab work in the next 48 hours. Plan for discharge home. No lung or renal involvement.   I have reviewed and discussed all results (EKG, imaging, lab, urine as appropriate), exam findings with patient. I have reviewed nursing notes and appropriate previous records.  I feel the patient is safe to be discharged home without further emergent workup. Discussed usual and customary return precautions. Patient and family (if present) verbalize understanding and are comfortable with this plan.  Patient will follow-up with their primary care provider. If they do not have a primary care provider, information for follow-up has been provided to them. All questions  have been answered.  ____________________________________________  FINAL CLINICAL IMPRESSION(S) / ED DIAGNOSES  Final diagnoses:  DRESS syndrome     MEDICATIONS GIVEN DURING THIS VISIT:  Medications  sodium chloride 0.9 % bolus 1,000 mL (0 mLs Intravenous Stopped 06/29/17 2208)     NEW OUTPATIENT MEDICATIONS STARTED DURING THIS VISIT:  None   Note:  This document was prepared using Dragon voice recognition software and may include unintentional dictation errors.  Alona BeneJoshua Long, MD Emergency Medicine    Long, Arlyss RepressJoshua G, MD 06/30/17 [phone]

## 2017-06-29 NOTE — ED Notes (Signed)
ED Provider at bedside. 

## 2017-06-29 NOTE — Discharge Instructions (Signed)
You were seen in the ED today with rash that is from Lamictal. You should never take this drug again. Continue prednisone and see your PCP in the next 2 days for repeat appointment and lab work to make sure the liver enzymes are going down.   Return to the ED with any worsening fever, abdominal pain, yellowing of the eyes, or any sores that develop in the mouth.

## 2017-06-29 NOTE — ED Triage Notes (Signed)
Pt mom states pt is still having hives and swelling. Pt was seen for the same over the weekend.

## 2017-06-30 LAB — CULTURE, GROUP A STREP (THRC)

## 2017-07-01 DIAGNOSIS — E119 Type 2 diabetes mellitus without complications: Secondary | ICD-10-CM | POA: Diagnosis not present

## 2017-07-01 DIAGNOSIS — F3189 Other bipolar disorder: Secondary | ICD-10-CM | POA: Diagnosis not present

## 2017-07-01 DIAGNOSIS — T426X5A Adverse effect of other antiepileptic and sedative-hypnotic drugs, initial encounter: Secondary | ICD-10-CM | POA: Diagnosis not present

## 2017-07-01 DIAGNOSIS — L511 Stevens-Johnson syndrome: Secondary | ICD-10-CM | POA: Diagnosis not present

## 2017-07-01 DIAGNOSIS — I10 Essential (primary) hypertension: Secondary | ICD-10-CM | POA: Diagnosis not present

## 2017-07-02 DIAGNOSIS — F319 Bipolar disorder, unspecified: Secondary | ICD-10-CM | POA: Diagnosis present

## 2017-07-02 DIAGNOSIS — T886XXA Anaphylactic reaction due to adverse effect of correct drug or medicament properly administered, initial encounter: Secondary | ICD-10-CM | POA: Diagnosis not present

## 2017-07-02 DIAGNOSIS — E119 Type 2 diabetes mellitus without complications: Secondary | ICD-10-CM | POA: Diagnosis not present

## 2017-07-02 DIAGNOSIS — Z79899 Other long term (current) drug therapy: Secondary | ICD-10-CM | POA: Diagnosis not present

## 2017-07-02 DIAGNOSIS — T426X5A Adverse effect of other antiepileptic and sedative-hypnotic drugs, initial encounter: Secondary | ICD-10-CM | POA: Diagnosis not present

## 2017-07-02 DIAGNOSIS — F3189 Other bipolar disorder: Secondary | ICD-10-CM | POA: Diagnosis not present

## 2017-07-02 DIAGNOSIS — Z8249 Family history of ischemic heart disease and other diseases of the circulatory system: Secondary | ICD-10-CM | POA: Diagnosis not present

## 2017-07-02 DIAGNOSIS — L511 Stevens-Johnson syndrome: Secondary | ICD-10-CM | POA: Diagnosis not present

## 2017-07-02 DIAGNOSIS — I10 Essential (primary) hypertension: Secondary | ICD-10-CM | POA: Diagnosis not present

## 2017-07-02 DIAGNOSIS — F79 Unspecified intellectual disabilities: Secondary | ICD-10-CM | POA: Diagnosis present

## 2017-07-02 DIAGNOSIS — Z888 Allergy status to other drugs, medicaments and biological substances status: Secondary | ICD-10-CM | POA: Diagnosis not present

## 2017-07-02 DIAGNOSIS — Z7984 Long term (current) use of oral hypoglycemic drugs: Secondary | ICD-10-CM | POA: Diagnosis not present

## 2017-07-02 LAB — CULTURE, BLOOD (ROUTINE X 2)
CULTURE: NO GROWTH
Culture: NO GROWTH
SPECIAL REQUESTS: ADEQUATE
Special Requests: ADEQUATE

## 2017-07-12 DIAGNOSIS — L511 Stevens-Johnson syndrome: Secondary | ICD-10-CM | POA: Diagnosis not present

## 2017-07-12 DIAGNOSIS — F99 Mental disorder, not otherwise specified: Secondary | ICD-10-CM | POA: Diagnosis not present

## 2017-09-13 DIAGNOSIS — I10 Essential (primary) hypertension: Secondary | ICD-10-CM | POA: Diagnosis not present

## 2017-09-13 DIAGNOSIS — E119 Type 2 diabetes mellitus without complications: Secondary | ICD-10-CM | POA: Diagnosis not present

## 2017-10-25 DIAGNOSIS — E119 Type 2 diabetes mellitus without complications: Secondary | ICD-10-CM | POA: Diagnosis not present

## 2017-10-25 DIAGNOSIS — I10 Essential (primary) hypertension: Secondary | ICD-10-CM | POA: Diagnosis not present

## 2017-11-02 DIAGNOSIS — I10 Essential (primary) hypertension: Secondary | ICD-10-CM | POA: Diagnosis not present

## 2017-11-02 DIAGNOSIS — L511 Stevens-Johnson syndrome: Secondary | ICD-10-CM | POA: Diagnosis not present

## 2017-11-02 DIAGNOSIS — F99 Mental disorder, not otherwise specified: Secondary | ICD-10-CM | POA: Diagnosis not present

## 2017-11-02 DIAGNOSIS — E119 Type 2 diabetes mellitus without complications: Secondary | ICD-10-CM | POA: Diagnosis not present

## 2017-11-02 DIAGNOSIS — F3110 Bipolar disorder, current episode manic without psychotic features, unspecified: Secondary | ICD-10-CM | POA: Diagnosis not present

## 2017-11-19 DIAGNOSIS — M545 Low back pain: Secondary | ICD-10-CM | POA: Diagnosis not present

## 2017-11-19 DIAGNOSIS — M419 Scoliosis, unspecified: Secondary | ICD-10-CM | POA: Diagnosis not present

## 2017-11-19 DIAGNOSIS — Z981 Arthrodesis status: Secondary | ICD-10-CM | POA: Diagnosis not present

## 2017-11-19 DIAGNOSIS — M954 Acquired deformity of chest and rib: Secondary | ICD-10-CM | POA: Diagnosis not present

## 2017-11-19 DIAGNOSIS — T84216D Breakdown (mechanical) of internal fixation device of vertebrae, subsequent encounter: Secondary | ICD-10-CM | POA: Diagnosis not present

## 2017-12-08 DIAGNOSIS — I10 Essential (primary) hypertension: Secondary | ICD-10-CM | POA: Diagnosis not present

## 2017-12-08 DIAGNOSIS — F3110 Bipolar disorder, current episode manic without psychotic features, unspecified: Secondary | ICD-10-CM | POA: Diagnosis not present

## 2017-12-08 DIAGNOSIS — E119 Type 2 diabetes mellitus without complications: Secondary | ICD-10-CM | POA: Diagnosis not present

## 2017-12-22 DIAGNOSIS — E119 Type 2 diabetes mellitus without complications: Secondary | ICD-10-CM | POA: Diagnosis not present

## 2017-12-22 DIAGNOSIS — F3110 Bipolar disorder, current episode manic without psychotic features, unspecified: Secondary | ICD-10-CM | POA: Diagnosis not present

## 2017-12-22 DIAGNOSIS — I10 Essential (primary) hypertension: Secondary | ICD-10-CM | POA: Diagnosis not present

## 2018-01-28 DIAGNOSIS — E119 Type 2 diabetes mellitus without complications: Secondary | ICD-10-CM | POA: Diagnosis not present

## 2018-01-28 DIAGNOSIS — F3110 Bipolar disorder, current episode manic without psychotic features, unspecified: Secondary | ICD-10-CM | POA: Diagnosis not present

## 2018-01-28 DIAGNOSIS — I10 Essential (primary) hypertension: Secondary | ICD-10-CM | POA: Diagnosis not present

## 2018-02-17 DIAGNOSIS — I10 Essential (primary) hypertension: Secondary | ICD-10-CM | POA: Diagnosis not present

## 2018-02-17 DIAGNOSIS — F99 Mental disorder, not otherwise specified: Secondary | ICD-10-CM | POA: Diagnosis not present

## 2018-02-17 DIAGNOSIS — L511 Stevens-Johnson syndrome: Secondary | ICD-10-CM | POA: Diagnosis not present

## 2018-02-17 DIAGNOSIS — Z1389 Encounter for screening for other disorder: Secondary | ICD-10-CM | POA: Diagnosis not present

## 2018-02-17 DIAGNOSIS — E119 Type 2 diabetes mellitus without complications: Secondary | ICD-10-CM | POA: Diagnosis not present

## 2018-02-17 DIAGNOSIS — F3189 Other bipolar disorder: Secondary | ICD-10-CM | POA: Diagnosis not present

## 2018-02-17 DIAGNOSIS — Z0000 Encounter for general adult medical examination without abnormal findings: Secondary | ICD-10-CM | POA: Diagnosis not present

## 2018-02-26 IMAGING — CT CT HEAD W/O CM
5 of 8 series · 16 of 47 positions shown, 18 images · non-contrast
Comparison: None.

CLINICAL DATA: Fall this morning

EXAM:
CT HEAD WITHOUT CONTRAST
CT CERVICAL SPINE WITHOUT CONTRAST
TECHNIQUE: Multidetector CT imaging of the head and cervical spine was
performed following the standard protocol without intravenous
contrast. Multiplanar CT image reconstructions of the cervical spine
were also generated.

[Series 3: head w/o · axial · non-contrast · 0.49mm/px · z∈[+1339,+1395]mm · 2 of 42 slices shown]
[im 14/42  brain]
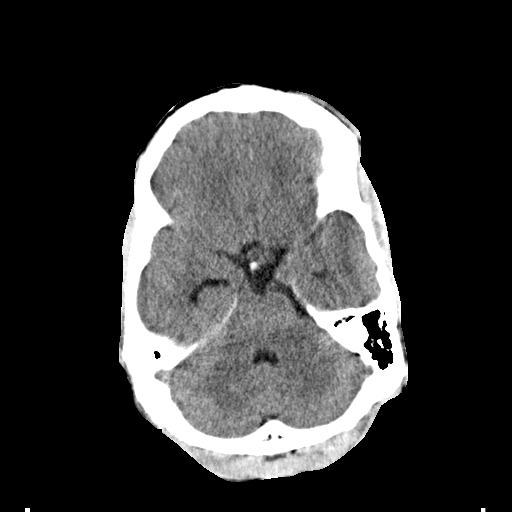
[im 28/42  brain]
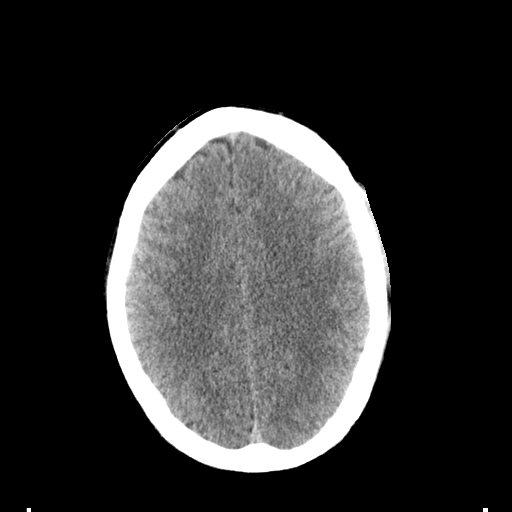

[Series 4: head bone · axial · 0.49mm/px · z∈[+1309,+1335]mm · 2 of 83 slices shown]
[im 12/83  bone]
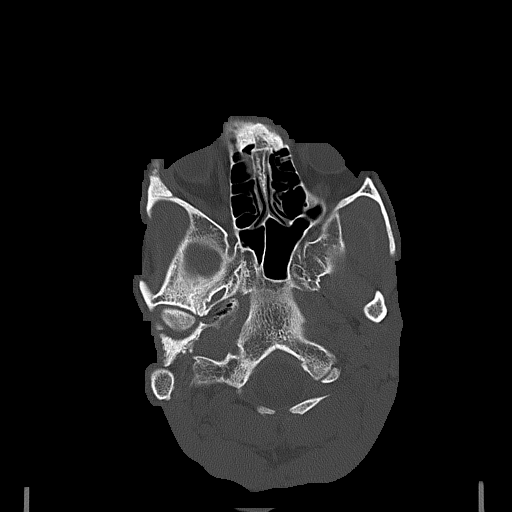
[im 24/83  bone]
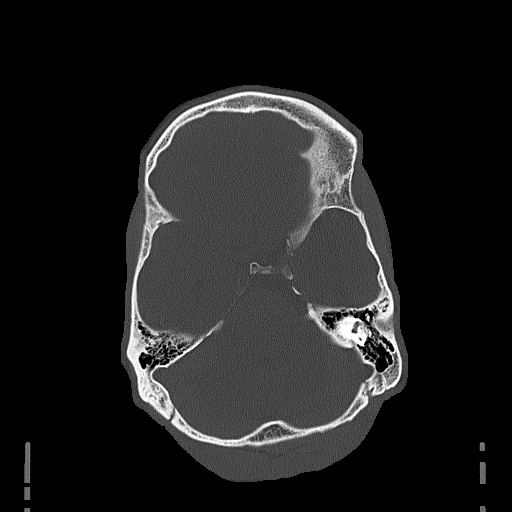

[Series 5: coronal · coronal · 0.33mm/px · 3 of 69 slices shown]
[im 26/69  brain]
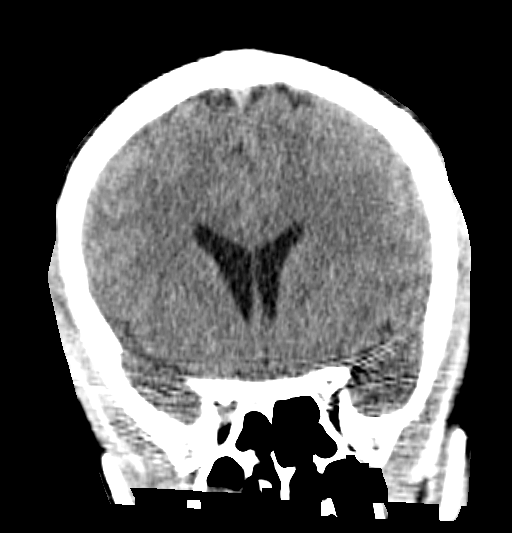
[im 35/69  brain]
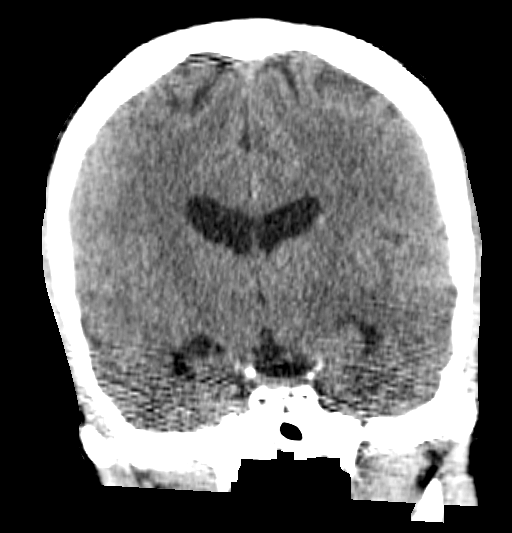
[im 43/69  brain]
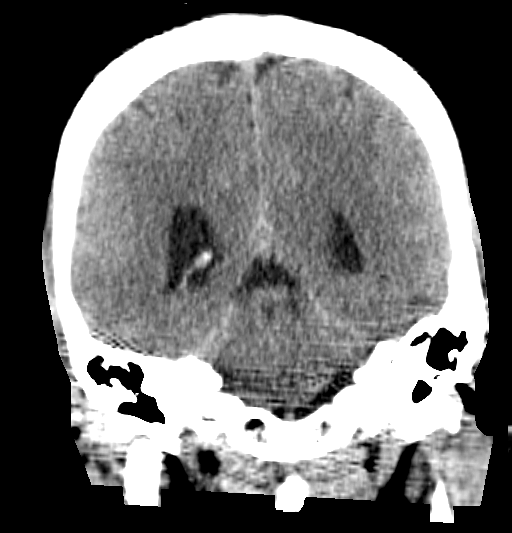

[Series 6: sagittal · sagittal · 0.33mm/px · 1 of 53 slices shown]
[im 27/53  brain]
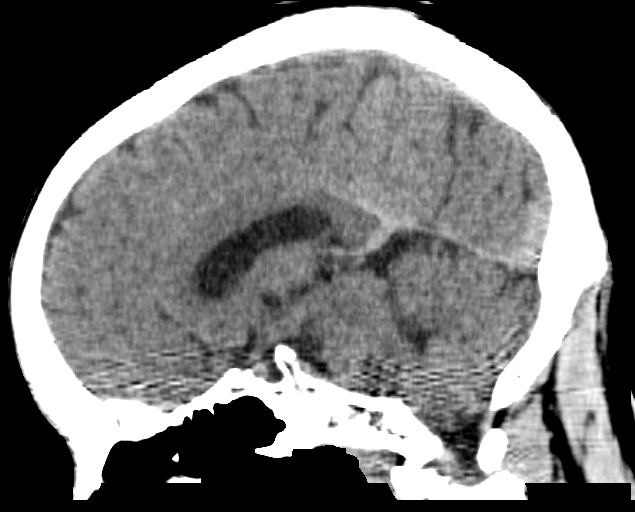

[Series 11: orthogonal axial · axial · 0.17mm/px · z∈[+1156,+1290]mm · 8 of 100 slices shown, 10 images]
[im 12/100  brain]
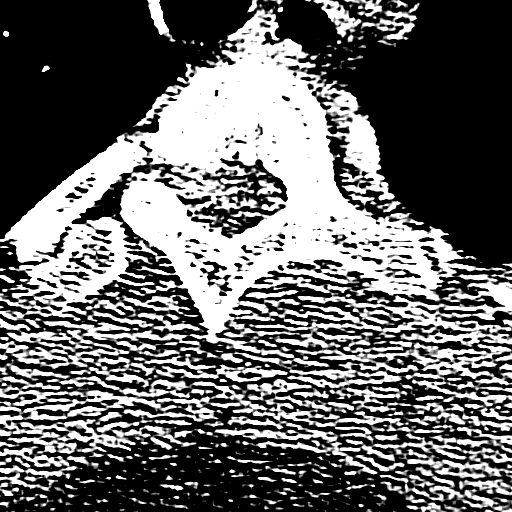
[im 12/100  bone]
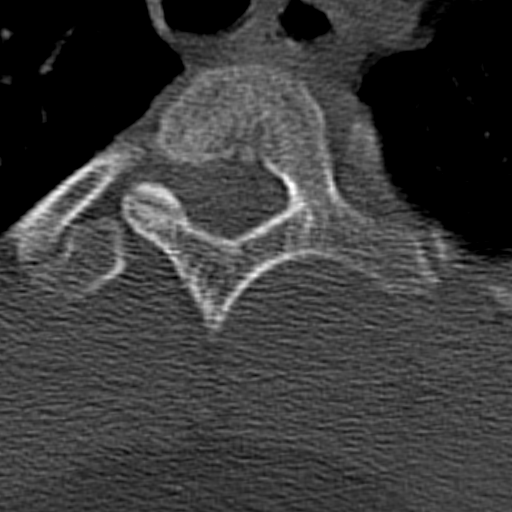
[im 23/100  brain]
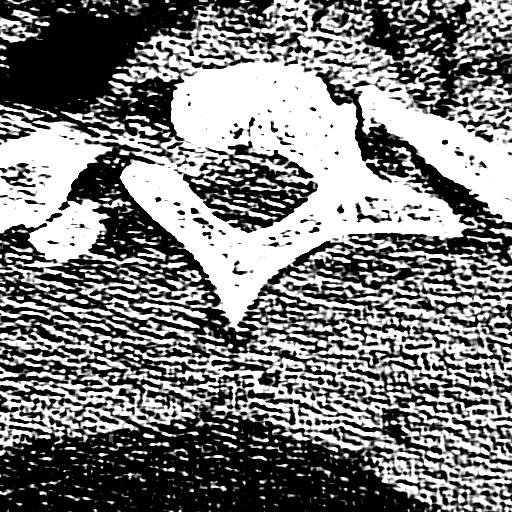
[im 34/100  brain]
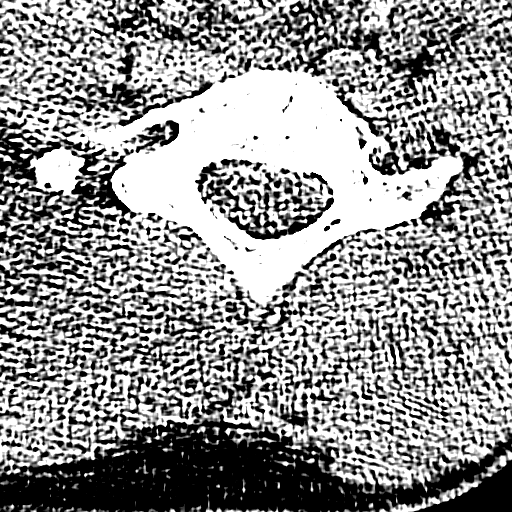
[im 45/100  brain]
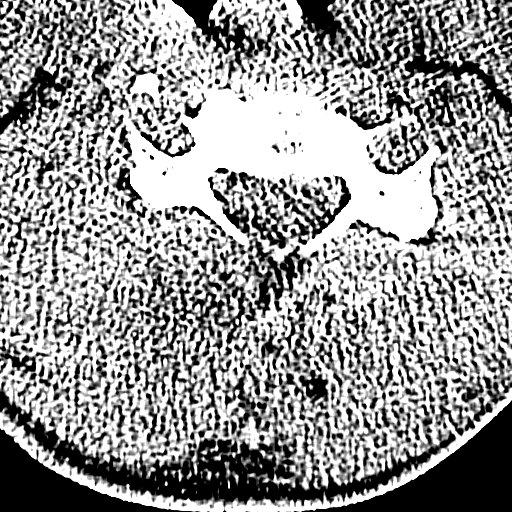
[im 56/100  brain]
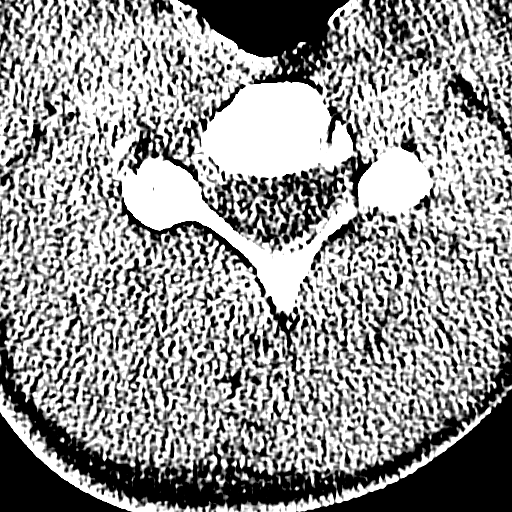
[im 56/100  bone]
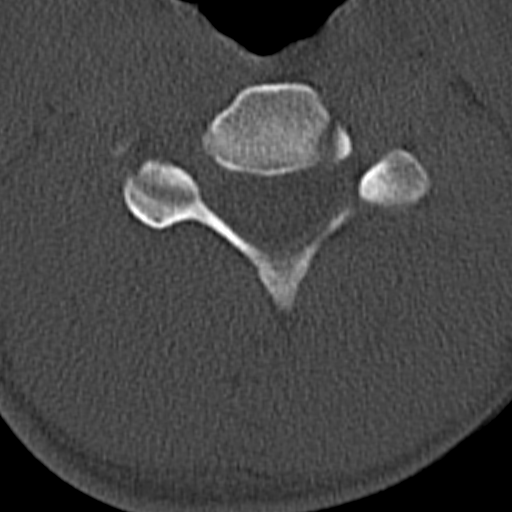
[im 67/100  brain]
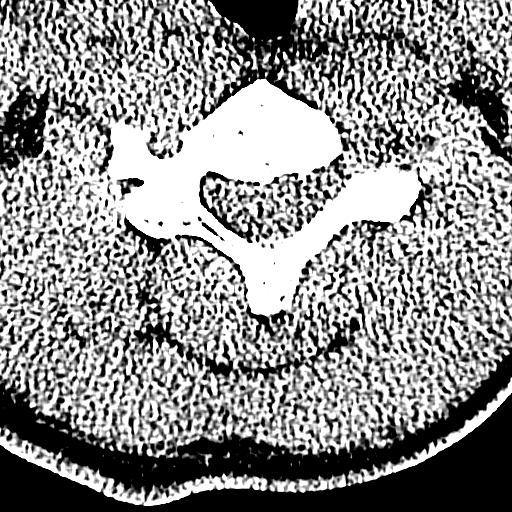
[im 78/100  brain]
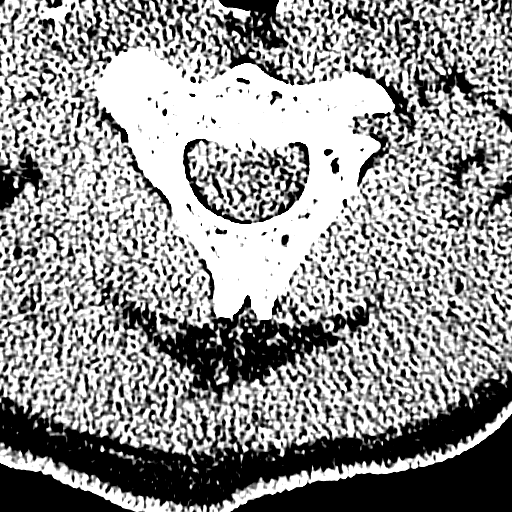
[im 89/100  brain]
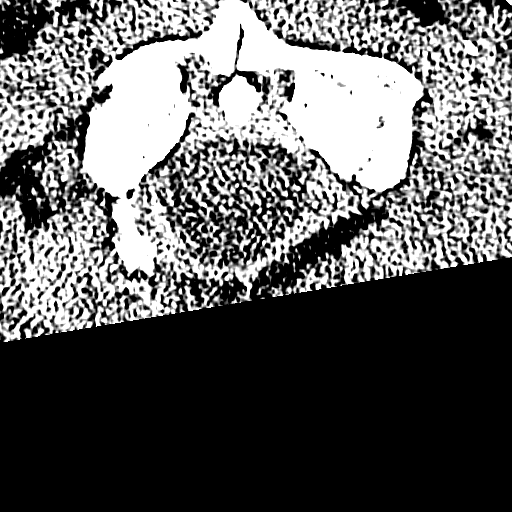

[16 of 47 positions shown; findings below may reference images not displayed]

FINDINGS: CT HEAD FINDINGS

Brain: No intracranial hemorrhage, mass effect or midline shift. No
acute cortical infarction. No hydrocephalus. No mass lesion is noted
on this unenhanced scan. The gray and white-matter differentiation
is preserved.

Vascular: No hyperdense vessel or unexpected calcification.

Skull: No skull fracture is noted.

Sinuses/Orbits: There is mucosal thickening with almost complete
opacification right maxillary sinus. Probable mucous retention cyst
left maxillary sinus measures 2.3 cm. The mastoid air cells are
unremarkable.

Other: None

CT CERVICAL SPINE FINDINGS

Alignment: There is mild reversal of cervical lordosis.

Skull base and vertebrae: No acute fracture or subluxation. C1-C2
relationship is unremarkable. Axial image 13 there is probable
congenital unfused anterior arch of C1.

Soft tissues and spinal canal: No prevertebral soft tissue swelling.
Spinal canal is patent.

Disc levels: There is mild disc space flattening with mild posterior
spurring at C6-C7 level. Mild posterior disc bulge at C6-C7 level.

Upper chest: The visualized lung apices shows no evidence of
pneumothorax.

Other: None
IMPRESSION: 1. No acute intracranial abnormality. Paranasal sinuses disease as
described above.
2. No cervical spine acute fracture or subluxation. Mild
degenerative changes at C6-C7 level.
3. Probable congenital unfused anterior arch of C1 vertebral body.

## 2018-03-07 DIAGNOSIS — E119 Type 2 diabetes mellitus without complications: Secondary | ICD-10-CM | POA: Diagnosis not present

## 2018-03-07 DIAGNOSIS — I10 Essential (primary) hypertension: Secondary | ICD-10-CM | POA: Diagnosis not present

## 2018-03-07 DIAGNOSIS — F3189 Other bipolar disorder: Secondary | ICD-10-CM | POA: Diagnosis not present

## 2018-06-02 DIAGNOSIS — E119 Type 2 diabetes mellitus without complications: Secondary | ICD-10-CM | POA: Diagnosis not present

## 2018-06-02 DIAGNOSIS — I10 Essential (primary) hypertension: Secondary | ICD-10-CM | POA: Diagnosis not present

## 2018-06-02 DIAGNOSIS — F3189 Other bipolar disorder: Secondary | ICD-10-CM | POA: Diagnosis not present

## 2018-06-02 DIAGNOSIS — F99 Mental disorder, not otherwise specified: Secondary | ICD-10-CM | POA: Diagnosis not present

## 2018-06-02 DIAGNOSIS — L511 Stevens-Johnson syndrome: Secondary | ICD-10-CM | POA: Diagnosis not present

## 2018-07-14 DIAGNOSIS — I10 Essential (primary) hypertension: Secondary | ICD-10-CM | POA: Diagnosis not present

## 2018-07-14 DIAGNOSIS — F99 Mental disorder, not otherwise specified: Secondary | ICD-10-CM | POA: Diagnosis not present

## 2018-07-14 DIAGNOSIS — L511 Stevens-Johnson syndrome: Secondary | ICD-10-CM | POA: Diagnosis not present

## 2018-07-14 DIAGNOSIS — F3189 Other bipolar disorder: Secondary | ICD-10-CM | POA: Diagnosis not present

## 2018-07-14 DIAGNOSIS — E119 Type 2 diabetes mellitus without complications: Secondary | ICD-10-CM | POA: Diagnosis not present

## 2018-08-25 DIAGNOSIS — I10 Essential (primary) hypertension: Secondary | ICD-10-CM | POA: Diagnosis not present

## 2018-08-25 DIAGNOSIS — F99 Mental disorder, not otherwise specified: Secondary | ICD-10-CM | POA: Diagnosis not present

## 2018-08-25 DIAGNOSIS — F3189 Other bipolar disorder: Secondary | ICD-10-CM | POA: Diagnosis not present

## 2018-08-25 DIAGNOSIS — L511 Stevens-Johnson syndrome: Secondary | ICD-10-CM | POA: Diagnosis not present

## 2018-08-25 DIAGNOSIS — E119 Type 2 diabetes mellitus without complications: Secondary | ICD-10-CM | POA: Diagnosis not present

## 2018-09-13 DIAGNOSIS — F3189 Other bipolar disorder: Secondary | ICD-10-CM | POA: Diagnosis not present

## 2018-09-13 DIAGNOSIS — L511 Stevens-Johnson syndrome: Secondary | ICD-10-CM | POA: Diagnosis not present

## 2018-09-13 DIAGNOSIS — I10 Essential (primary) hypertension: Secondary | ICD-10-CM | POA: Diagnosis not present

## 2018-09-13 DIAGNOSIS — E119 Type 2 diabetes mellitus without complications: Secondary | ICD-10-CM | POA: Diagnosis not present

## 2018-09-13 DIAGNOSIS — F99 Mental disorder, not otherwise specified: Secondary | ICD-10-CM | POA: Diagnosis not present

## 2018-09-15 DIAGNOSIS — E119 Type 2 diabetes mellitus without complications: Secondary | ICD-10-CM | POA: Diagnosis not present

## 2018-09-15 DIAGNOSIS — F3189 Other bipolar disorder: Secondary | ICD-10-CM | POA: Diagnosis not present

## 2018-09-15 DIAGNOSIS — F99 Mental disorder, not otherwise specified: Secondary | ICD-10-CM | POA: Diagnosis not present

## 2018-09-15 DIAGNOSIS — I10 Essential (primary) hypertension: Secondary | ICD-10-CM | POA: Diagnosis not present

## 2018-09-15 DIAGNOSIS — L511 Stevens-Johnson syndrome: Secondary | ICD-10-CM | POA: Diagnosis not present

## 2018-10-11 DIAGNOSIS — E119 Type 2 diabetes mellitus without complications: Secondary | ICD-10-CM | POA: Diagnosis not present

## 2018-10-11 DIAGNOSIS — F3189 Other bipolar disorder: Secondary | ICD-10-CM | POA: Diagnosis not present

## 2018-10-11 DIAGNOSIS — I10 Essential (primary) hypertension: Secondary | ICD-10-CM | POA: Diagnosis not present

## 2018-11-01 DIAGNOSIS — F3189 Other bipolar disorder: Secondary | ICD-10-CM | POA: Diagnosis not present

## 2018-11-01 DIAGNOSIS — I10 Essential (primary) hypertension: Secondary | ICD-10-CM | POA: Diagnosis not present

## 2018-11-01 DIAGNOSIS — E119 Type 2 diabetes mellitus without complications: Secondary | ICD-10-CM | POA: Diagnosis not present

## 2018-11-25 DIAGNOSIS — Z4789 Encounter for other orthopedic aftercare: Secondary | ICD-10-CM | POA: Diagnosis not present

## 2018-11-25 DIAGNOSIS — M419 Scoliosis, unspecified: Secondary | ICD-10-CM | POA: Diagnosis not present

## 2018-11-25 DIAGNOSIS — Z981 Arthrodesis status: Secondary | ICD-10-CM | POA: Diagnosis not present

## 2018-11-30 DIAGNOSIS — I10 Essential (primary) hypertension: Secondary | ICD-10-CM | POA: Diagnosis not present

## 2018-11-30 DIAGNOSIS — F3189 Other bipolar disorder: Secondary | ICD-10-CM | POA: Diagnosis not present

## 2018-11-30 DIAGNOSIS — E119 Type 2 diabetes mellitus without complications: Secondary | ICD-10-CM | POA: Diagnosis not present

## 2018-12-27 DIAGNOSIS — F72 Severe intellectual disabilities: Secondary | ICD-10-CM | POA: Diagnosis not present

## 2018-12-30 DIAGNOSIS — F72 Severe intellectual disabilities: Secondary | ICD-10-CM | POA: Diagnosis not present

## 2018-12-31 DIAGNOSIS — F72 Severe intellectual disabilities: Secondary | ICD-10-CM | POA: Diagnosis not present

## 2019-01-04 DIAGNOSIS — F99 Mental disorder, not otherwise specified: Secondary | ICD-10-CM | POA: Diagnosis not present

## 2019-01-04 DIAGNOSIS — L511 Stevens-Johnson syndrome: Secondary | ICD-10-CM | POA: Diagnosis not present

## 2019-01-04 DIAGNOSIS — E119 Type 2 diabetes mellitus without complications: Secondary | ICD-10-CM | POA: Diagnosis not present

## 2019-01-04 DIAGNOSIS — F3189 Other bipolar disorder: Secondary | ICD-10-CM | POA: Diagnosis not present

## 2019-01-04 DIAGNOSIS — I10 Essential (primary) hypertension: Secondary | ICD-10-CM | POA: Diagnosis not present

## 2019-01-09 DIAGNOSIS — I10 Essential (primary) hypertension: Secondary | ICD-10-CM | POA: Diagnosis not present

## 2019-01-09 DIAGNOSIS — F3189 Other bipolar disorder: Secondary | ICD-10-CM | POA: Diagnosis not present

## 2019-01-09 DIAGNOSIS — F99 Mental disorder, not otherwise specified: Secondary | ICD-10-CM | POA: Diagnosis not present

## 2019-01-09 DIAGNOSIS — E119 Type 2 diabetes mellitus without complications: Secondary | ICD-10-CM | POA: Diagnosis not present

## 2019-02-21 DIAGNOSIS — F3189 Other bipolar disorder: Secondary | ICD-10-CM | POA: Diagnosis not present

## 2019-02-21 DIAGNOSIS — I10 Essential (primary) hypertension: Secondary | ICD-10-CM | POA: Diagnosis not present

## 2019-02-21 DIAGNOSIS — F99 Mental disorder, not otherwise specified: Secondary | ICD-10-CM | POA: Diagnosis not present

## 2019-02-21 DIAGNOSIS — E119 Type 2 diabetes mellitus without complications: Secondary | ICD-10-CM | POA: Diagnosis not present

## 2019-03-30 DIAGNOSIS — I10 Essential (primary) hypertension: Secondary | ICD-10-CM | POA: Diagnosis not present

## 2019-03-30 DIAGNOSIS — F3189 Other bipolar disorder: Secondary | ICD-10-CM | POA: Diagnosis not present

## 2019-03-30 DIAGNOSIS — E119 Type 2 diabetes mellitus without complications: Secondary | ICD-10-CM | POA: Diagnosis not present

## 2019-03-30 DIAGNOSIS — F99 Mental disorder, not otherwise specified: Secondary | ICD-10-CM | POA: Diagnosis not present

## 2019-04-19 DIAGNOSIS — F3189 Other bipolar disorder: Secondary | ICD-10-CM | POA: Diagnosis not present

## 2019-04-19 DIAGNOSIS — I10 Essential (primary) hypertension: Secondary | ICD-10-CM | POA: Diagnosis not present

## 2019-04-19 DIAGNOSIS — E119 Type 2 diabetes mellitus without complications: Secondary | ICD-10-CM | POA: Diagnosis not present

## 2019-04-19 DIAGNOSIS — F99 Mental disorder, not otherwise specified: Secondary | ICD-10-CM | POA: Diagnosis not present

## 2019-04-27 DIAGNOSIS — E119 Type 2 diabetes mellitus without complications: Secondary | ICD-10-CM | POA: Diagnosis not present

## 2019-04-27 DIAGNOSIS — L511 Stevens-Johnson syndrome: Secondary | ICD-10-CM | POA: Diagnosis not present

## 2019-04-27 DIAGNOSIS — I10 Essential (primary) hypertension: Secondary | ICD-10-CM | POA: Diagnosis not present

## 2019-04-27 DIAGNOSIS — F99 Mental disorder, not otherwise specified: Secondary | ICD-10-CM | POA: Diagnosis not present

## 2019-04-27 DIAGNOSIS — Z1389 Encounter for screening for other disorder: Secondary | ICD-10-CM | POA: Diagnosis not present

## 2019-04-27 DIAGNOSIS — Z0000 Encounter for general adult medical examination without abnormal findings: Secondary | ICD-10-CM | POA: Diagnosis not present

## 2019-04-27 DIAGNOSIS — F3189 Other bipolar disorder: Secondary | ICD-10-CM | POA: Diagnosis not present

## 2019-08-09 DIAGNOSIS — E119 Type 2 diabetes mellitus without complications: Secondary | ICD-10-CM | POA: Diagnosis not present

## 2019-08-09 DIAGNOSIS — I10 Essential (primary) hypertension: Secondary | ICD-10-CM | POA: Diagnosis not present

## 2019-08-09 DIAGNOSIS — L511 Stevens-Johnson syndrome: Secondary | ICD-10-CM | POA: Diagnosis not present

## 2019-08-09 DIAGNOSIS — F99 Mental disorder, not otherwise specified: Secondary | ICD-10-CM | POA: Diagnosis not present

## 2019-08-09 DIAGNOSIS — F3189 Other bipolar disorder: Secondary | ICD-10-CM | POA: Diagnosis not present

## 2019-10-23 DIAGNOSIS — I10 Essential (primary) hypertension: Secondary | ICD-10-CM | POA: Diagnosis not present

## 2019-10-23 DIAGNOSIS — F99 Mental disorder, not otherwise specified: Secondary | ICD-10-CM | POA: Diagnosis not present

## 2019-10-23 DIAGNOSIS — E119 Type 2 diabetes mellitus without complications: Secondary | ICD-10-CM | POA: Diagnosis not present

## 2019-10-23 DIAGNOSIS — F3189 Other bipolar disorder: Secondary | ICD-10-CM | POA: Diagnosis not present

## 2019-10-23 DIAGNOSIS — L511 Stevens-Johnson syndrome: Secondary | ICD-10-CM | POA: Diagnosis not present

## 2019-11-27 DIAGNOSIS — L511 Stevens-Johnson syndrome: Secondary | ICD-10-CM | POA: Diagnosis not present

## 2019-11-27 DIAGNOSIS — I10 Essential (primary) hypertension: Secondary | ICD-10-CM | POA: Diagnosis not present

## 2019-11-27 DIAGNOSIS — F99 Mental disorder, not otherwise specified: Secondary | ICD-10-CM | POA: Diagnosis not present

## 2019-11-27 DIAGNOSIS — F3189 Other bipolar disorder: Secondary | ICD-10-CM | POA: Diagnosis not present

## 2019-11-27 DIAGNOSIS — E119 Type 2 diabetes mellitus without complications: Secondary | ICD-10-CM | POA: Diagnosis not present

## 2019-11-29 DIAGNOSIS — F99 Mental disorder, not otherwise specified: Secondary | ICD-10-CM | POA: Diagnosis not present

## 2019-11-29 DIAGNOSIS — F3189 Other bipolar disorder: Secondary | ICD-10-CM | POA: Diagnosis not present

## 2019-11-29 DIAGNOSIS — L511 Stevens-Johnson syndrome: Secondary | ICD-10-CM | POA: Diagnosis not present

## 2019-11-29 DIAGNOSIS — I10 Essential (primary) hypertension: Secondary | ICD-10-CM | POA: Diagnosis not present

## 2019-11-29 DIAGNOSIS — E119 Type 2 diabetes mellitus without complications: Secondary | ICD-10-CM | POA: Diagnosis not present

## 2019-12-01 DIAGNOSIS — S22089D Unspecified fracture of T11-T12 vertebra, subsequent encounter for fracture with routine healing: Secondary | ICD-10-CM | POA: Diagnosis not present

## 2019-12-01 DIAGNOSIS — M4185 Other forms of scoliosis, thoracolumbar region: Secondary | ICD-10-CM | POA: Diagnosis not present

## 2019-12-01 DIAGNOSIS — M954 Acquired deformity of chest and rib: Secondary | ICD-10-CM | POA: Diagnosis not present

## 2019-12-01 DIAGNOSIS — S22059D Unspecified fracture of T5-T6 vertebra, subsequent encounter for fracture with routine healing: Secondary | ICD-10-CM | POA: Diagnosis not present

## 2019-12-01 DIAGNOSIS — M4125 Other idiopathic scoliosis, thoracolumbar region: Secondary | ICD-10-CM | POA: Diagnosis not present

## 2019-12-01 DIAGNOSIS — T84218D Breakdown (mechanical) of internal fixation device of other bones, subsequent encounter: Secondary | ICD-10-CM | POA: Diagnosis not present

## 2019-12-01 DIAGNOSIS — Z981 Arthrodesis status: Secondary | ICD-10-CM | POA: Diagnosis not present

## 2019-12-01 DIAGNOSIS — S22069D Unspecified fracture of T7-T8 vertebra, subsequent encounter for fracture with routine healing: Secondary | ICD-10-CM | POA: Diagnosis not present

## 2019-12-01 DIAGNOSIS — Z4789 Encounter for other orthopedic aftercare: Secondary | ICD-10-CM | POA: Diagnosis not present

## 2019-12-01 DIAGNOSIS — S22079D Unspecified fracture of T9-T10 vertebra, subsequent encounter for fracture with routine healing: Secondary | ICD-10-CM | POA: Diagnosis not present

## 2020-01-25 DIAGNOSIS — E119 Type 2 diabetes mellitus without complications: Secondary | ICD-10-CM | POA: Diagnosis not present

## 2020-01-25 DIAGNOSIS — F99 Mental disorder, not otherwise specified: Secondary | ICD-10-CM | POA: Diagnosis not present

## 2020-01-25 DIAGNOSIS — I10 Essential (primary) hypertension: Secondary | ICD-10-CM | POA: Diagnosis not present

## 2020-01-25 DIAGNOSIS — L511 Stevens-Johnson syndrome: Secondary | ICD-10-CM | POA: Diagnosis not present

## 2020-01-25 DIAGNOSIS — F3189 Other bipolar disorder: Secondary | ICD-10-CM | POA: Diagnosis not present

## 2020-02-02 ENCOUNTER — Ambulatory Visit: Payer: Medicare Other | Attending: Internal Medicine

## 2020-02-02 DIAGNOSIS — Z23 Encounter for immunization: Secondary | ICD-10-CM

## 2020-02-02 NOTE — Progress Notes (Signed)
   Covid-19 Vaccination Clinic  Name:  Phillip White    MRN: [id number] DOB: 10-Jul-1981  02/02/2020  [name] was observed post Covid-19 immunization for 30 minutes based on pre-vaccination screening without incident. He was provided with Vaccine Information Sheet and instruction to access the V-Safe system.   [name] was instructed to call 911 with any severe reactions post vaccine: Marland Kitchen Difficulty breathing  . Swelling of face and throat  . A fast heartbeat  . A bad rash all over body  . Dizziness and weakness   Immunizations Administered    Name Date Dose VIS Date Route   Pfizer COVID-19 Vaccine 02/02/2020  4:08 PM 0.3 mL 10/27/2019 Intramuscular   Manufacturer: [employer], [employer]   Lot: [id number]   NDC: [address]-3

## 2020-02-23 ENCOUNTER — Ambulatory Visit: Payer: Medicare Other | Attending: Internal Medicine

## 2020-02-23 DIAGNOSIS — F99 Mental disorder, not otherwise specified: Secondary | ICD-10-CM | POA: Diagnosis not present

## 2020-02-23 DIAGNOSIS — Z23 Encounter for immunization: Secondary | ICD-10-CM

## 2020-02-23 DIAGNOSIS — L511 Stevens-Johnson syndrome: Secondary | ICD-10-CM | POA: Diagnosis not present

## 2020-02-23 DIAGNOSIS — I10 Essential (primary) hypertension: Secondary | ICD-10-CM | POA: Diagnosis not present

## 2020-02-23 DIAGNOSIS — F3189 Other bipolar disorder: Secondary | ICD-10-CM | POA: Diagnosis not present

## 2020-02-23 DIAGNOSIS — E119 Type 2 diabetes mellitus without complications: Secondary | ICD-10-CM | POA: Diagnosis not present

## 2020-02-23 NOTE — Progress Notes (Signed)
2  Covid-19 Vaccination Clinic  Name:  Rhyker Silversmith    MRN: [id number] DOB: [birth year]  02/23/2020  Mr. Pargas was observed post Covid-19 immunization for 15 minutes without incident. He was provided with Vaccine Information Sheet and instruction to access the V-Safe system.   Mr. Hammitt was instructed to call 911 with any severe reactions post vaccine: Marland Kitchen Difficulty breathing  . Swelling of face and throat  . A fast heartbeat  . A bad rash all over body  . Dizziness and weakness   Immunizations Administered    Name Date Dose VIS Date Route   Pfizer COVID-19 Vaccine 02/23/2020  3:09 PM 0.3 mL 10/27/2019 Intramuscular   Manufacturer: [employer], [employer]   Lot: [id number] [id number]   NDC: [address]-2

## 2020-03-18 DIAGNOSIS — F3189 Other bipolar disorder: Secondary | ICD-10-CM | POA: Diagnosis not present

## 2020-03-18 DIAGNOSIS — I10 Essential (primary) hypertension: Secondary | ICD-10-CM | POA: Diagnosis not present

## 2020-03-18 DIAGNOSIS — F99 Mental disorder, not otherwise specified: Secondary | ICD-10-CM | POA: Diagnosis not present

## 2020-03-18 DIAGNOSIS — E119 Type 2 diabetes mellitus without complications: Secondary | ICD-10-CM | POA: Diagnosis not present

## 2020-07-02 DIAGNOSIS — F3189 Other bipolar disorder: Secondary | ICD-10-CM | POA: Diagnosis not present

## 2020-07-02 DIAGNOSIS — F99 Mental disorder, not otherwise specified: Secondary | ICD-10-CM | POA: Diagnosis not present

## 2020-07-02 DIAGNOSIS — E119 Type 2 diabetes mellitus without complications: Secondary | ICD-10-CM | POA: Diagnosis not present

## 2020-07-02 DIAGNOSIS — Z0000 Encounter for general adult medical examination without abnormal findings: Secondary | ICD-10-CM | POA: Diagnosis not present

## 2020-07-02 DIAGNOSIS — I10 Essential (primary) hypertension: Secondary | ICD-10-CM | POA: Diagnosis not present

## 2020-07-02 DIAGNOSIS — Z1331 Encounter for screening for depression: Secondary | ICD-10-CM | POA: Diagnosis not present

## 2020-10-29 DIAGNOSIS — E119 Type 2 diabetes mellitus without complications: Secondary | ICD-10-CM | POA: Diagnosis not present

## 2020-10-29 DIAGNOSIS — I10 Essential (primary) hypertension: Secondary | ICD-10-CM | POA: Diagnosis not present

## 2020-10-29 DIAGNOSIS — F99 Mental disorder, not otherwise specified: Secondary | ICD-10-CM | POA: Diagnosis not present

## 2020-10-29 DIAGNOSIS — F3189 Other bipolar disorder: Secondary | ICD-10-CM | POA: Diagnosis not present

## 2021-02-11 DIAGNOSIS — E119 Type 2 diabetes mellitus without complications: Secondary | ICD-10-CM | POA: Diagnosis not present

## 2021-02-11 DIAGNOSIS — F99 Mental disorder, not otherwise specified: Secondary | ICD-10-CM | POA: Diagnosis not present

## 2021-02-11 DIAGNOSIS — F3189 Other bipolar disorder: Secondary | ICD-10-CM | POA: Diagnosis not present

## 2021-02-11 DIAGNOSIS — I10 Essential (primary) hypertension: Secondary | ICD-10-CM | POA: Diagnosis not present

## 2021-06-04 DIAGNOSIS — F99 Mental disorder, not otherwise specified: Secondary | ICD-10-CM | POA: Diagnosis not present

## 2021-06-04 DIAGNOSIS — E119 Type 2 diabetes mellitus without complications: Secondary | ICD-10-CM | POA: Diagnosis not present

## 2021-06-04 DIAGNOSIS — F3189 Other bipolar disorder: Secondary | ICD-10-CM | POA: Diagnosis not present

## 2021-06-04 DIAGNOSIS — I10 Essential (primary) hypertension: Secondary | ICD-10-CM | POA: Diagnosis not present

## 2021-09-30 DIAGNOSIS — F3189 Other bipolar disorder: Secondary | ICD-10-CM | POA: Diagnosis not present

## 2021-09-30 DIAGNOSIS — F99 Mental disorder, not otherwise specified: Secondary | ICD-10-CM | POA: Diagnosis not present

## 2021-09-30 DIAGNOSIS — E119 Type 2 diabetes mellitus without complications: Secondary | ICD-10-CM | POA: Diagnosis not present

## 2021-09-30 DIAGNOSIS — Z0000 Encounter for general adult medical examination without abnormal findings: Secondary | ICD-10-CM | POA: Diagnosis not present

## 2021-09-30 DIAGNOSIS — Z1331 Encounter for screening for depression: Secondary | ICD-10-CM | POA: Diagnosis not present

## 2021-09-30 DIAGNOSIS — I10 Essential (primary) hypertension: Secondary | ICD-10-CM | POA: Diagnosis not present

## 2022-01-22 DIAGNOSIS — F3189 Other bipolar disorder: Secondary | ICD-10-CM | POA: Diagnosis not present

## 2022-01-22 DIAGNOSIS — F99 Mental disorder, not otherwise specified: Secondary | ICD-10-CM | POA: Diagnosis not present

## 2022-01-22 DIAGNOSIS — I10 Essential (primary) hypertension: Secondary | ICD-10-CM | POA: Diagnosis not present

## 2022-01-22 DIAGNOSIS — E119 Type 2 diabetes mellitus without complications: Secondary | ICD-10-CM | POA: Diagnosis not present

## 2022-02-11 DIAGNOSIS — M438X6 Other specified deforming dorsopathies, lumbar region: Secondary | ICD-10-CM | POA: Diagnosis not present

## 2022-02-11 DIAGNOSIS — M439 Deforming dorsopathy, unspecified: Secondary | ICD-10-CM | POA: Diagnosis not present

## 2022-02-11 DIAGNOSIS — Z981 Arthrodesis status: Secondary | ICD-10-CM | POA: Diagnosis not present

## 2022-02-11 DIAGNOSIS — S22089D Unspecified fracture of T11-T12 vertebra, subsequent encounter for fracture with routine healing: Secondary | ICD-10-CM | POA: Diagnosis not present

## 2022-02-11 DIAGNOSIS — M41125 Adolescent idiopathic scoliosis, thoracolumbar region: Secondary | ICD-10-CM | POA: Diagnosis not present

## 2022-02-11 DIAGNOSIS — Z888 Allergy status to other drugs, medicaments and biological substances status: Secondary | ICD-10-CM | POA: Diagnosis not present

## 2022-02-11 DIAGNOSIS — T84216D Breakdown (mechanical) of internal fixation device of vertebrae, subsequent encounter: Secondary | ICD-10-CM | POA: Diagnosis not present

## 2022-06-24 DIAGNOSIS — F3189 Other bipolar disorder: Secondary | ICD-10-CM | POA: Diagnosis not present

## 2022-06-24 DIAGNOSIS — I10 Essential (primary) hypertension: Secondary | ICD-10-CM | POA: Diagnosis not present

## 2022-06-24 DIAGNOSIS — E119 Type 2 diabetes mellitus without complications: Secondary | ICD-10-CM | POA: Diagnosis not present

## 2022-06-24 DIAGNOSIS — F99 Mental disorder, not otherwise specified: Secondary | ICD-10-CM | POA: Diagnosis not present

## 2022-12-15 DIAGNOSIS — Z6824 Body mass index (BMI) 24.0-24.9, adult: Secondary | ICD-10-CM | POA: Diagnosis not present

## 2022-12-15 DIAGNOSIS — Z0000 Encounter for general adult medical examination without abnormal findings: Secondary | ICD-10-CM | POA: Diagnosis not present

## 2022-12-15 DIAGNOSIS — I10 Essential (primary) hypertension: Secondary | ICD-10-CM | POA: Diagnosis not present

## 2022-12-15 DIAGNOSIS — E1169 Type 2 diabetes mellitus with other specified complication: Secondary | ICD-10-CM | POA: Diagnosis not present

## 2023-02-24 DIAGNOSIS — Z981 Arthrodesis status: Secondary | ICD-10-CM | POA: Diagnosis not present

## 2023-02-24 DIAGNOSIS — M41125 Adolescent idiopathic scoliosis, thoracolumbar region: Secondary | ICD-10-CM | POA: Diagnosis not present

## 2023-04-28 DIAGNOSIS — Z6823 Body mass index (BMI) 23.0-23.9, adult: Secondary | ICD-10-CM | POA: Diagnosis not present

## 2023-04-28 DIAGNOSIS — I10 Essential (primary) hypertension: Secondary | ICD-10-CM | POA: Diagnosis not present

## 2023-04-28 DIAGNOSIS — Z0000 Encounter for general adult medical examination without abnormal findings: Secondary | ICD-10-CM | POA: Diagnosis not present

## 2023-04-28 DIAGNOSIS — E782 Mixed hyperlipidemia: Secondary | ICD-10-CM | POA: Diagnosis not present

## 2023-04-28 DIAGNOSIS — E1169 Type 2 diabetes mellitus with other specified complication: Secondary | ICD-10-CM | POA: Diagnosis not present

## 2023-09-21 DIAGNOSIS — E1169 Type 2 diabetes mellitus with other specified complication: Secondary | ICD-10-CM | POA: Diagnosis not present

## 2023-09-21 DIAGNOSIS — I10 Essential (primary) hypertension: Secondary | ICD-10-CM | POA: Diagnosis not present

## 2023-09-21 DIAGNOSIS — Z6824 Body mass index (BMI) 24.0-24.9, adult: Secondary | ICD-10-CM | POA: Diagnosis not present

## 2024-02-09 DIAGNOSIS — E1169 Type 2 diabetes mellitus with other specified complication: Secondary | ICD-10-CM | POA: Diagnosis not present

## 2024-02-09 DIAGNOSIS — I10 Essential (primary) hypertension: Secondary | ICD-10-CM | POA: Diagnosis not present

## 2024-02-09 DIAGNOSIS — Z0000 Encounter for general adult medical examination without abnormal findings: Secondary | ICD-10-CM | POA: Diagnosis not present

## 2024-02-24 DIAGNOSIS — M41125 Adolescent idiopathic scoliosis, thoracolumbar region: Secondary | ICD-10-CM | POA: Diagnosis not present

## 2024-02-24 DIAGNOSIS — Z981 Arthrodesis status: Secondary | ICD-10-CM | POA: Diagnosis not present

## 2024-05-16 DIAGNOSIS — E1169 Type 2 diabetes mellitus with other specified complication: Secondary | ICD-10-CM | POA: Diagnosis not present

## 2024-05-16 DIAGNOSIS — Z0000 Encounter for general adult medical examination without abnormal findings: Secondary | ICD-10-CM | POA: Diagnosis not present

## 2024-05-16 DIAGNOSIS — I10 Essential (primary) hypertension: Secondary | ICD-10-CM | POA: Diagnosis not present

## 2024-08-23 NOTE — Progress Notes (Signed)
 Phillip White                                          MRN: [id number]   08/23/2024   The [facility] Quality Team Specialist reviewed this patient medical record for the purposes of chart review for care gap closure. The following were reviewed: chart review for care gap closure-kidney health evaluation for diabetes:eGFR  and uACR.    [facility] Quality Team

## 2024-09-07 DIAGNOSIS — Z0000 Encounter for general adult medical examination without abnormal findings: Secondary | ICD-10-CM | POA: Diagnosis not present

## 2024-09-07 DIAGNOSIS — I10 Essential (primary) hypertension: Secondary | ICD-10-CM | POA: Diagnosis not present

## 2024-09-07 DIAGNOSIS — E1169 Type 2 diabetes mellitus with other specified complication: Secondary | ICD-10-CM | POA: Diagnosis not present
